# Patient Record
Sex: Female | Born: 1960 | Hispanic: Yes | Smoking: Never smoker
Health system: Southern US, Community
[De-identification: ages and names within clinical notes are randomized; demographics above are authoritative.]

## PROBLEM LIST (undated history)

## (undated) DIAGNOSIS — D649 Anemia, unspecified: Secondary | ICD-10-CM

---

## 1983-09-23 HISTORY — PX: CHOLECYSTECTOMY: SHX55

## 2011-03-14 ENCOUNTER — Other Ambulatory Visit (HOSPITAL_COMMUNITY)
Admission: RE | Admit: 2011-03-14 | Discharge: 2011-03-14 | Disposition: A | Discharge status: Home / Self Care | Payer: 59 | Source: Ambulatory Visit | Attending: Family Medicine | Admitting: Family Medicine

## 2011-03-14 DIAGNOSIS — Z124 Encounter for screening for malignant neoplasm of cervix: Secondary | ICD-10-CM | POA: Insufficient documentation

## 2012-07-02 ENCOUNTER — Other Ambulatory Visit (HOSPITAL_COMMUNITY)
Admission: RE | Admit: 2012-07-02 | Discharge: 2012-07-02 | Disposition: A | Discharge status: Home / Self Care | Payer: 59 | Source: Ambulatory Visit | Attending: Obstetrics and Gynecology | Admitting: Obstetrics and Gynecology

## 2012-07-02 DIAGNOSIS — Z01419 Encounter for gynecological examination (general) (routine) without abnormal findings: Secondary | ICD-10-CM | POA: Insufficient documentation

## 2012-08-23 ENCOUNTER — Encounter (HOSPITAL_COMMUNITY): Payer: Self-pay | Admitting: Anesthesiology

## 2012-08-23 ENCOUNTER — Other Ambulatory Visit: Payer: Self-pay | Admitting: Obstetrics and Gynecology

## 2012-08-23 ENCOUNTER — Encounter (HOSPITAL_COMMUNITY)
Admission: RE | Disposition: A | Discharge status: Home / Self Care | Payer: Self-pay | Source: Ambulatory Visit | Attending: Obstetrics and Gynecology

## 2012-08-23 ENCOUNTER — Ambulatory Visit (HOSPITAL_COMMUNITY)
Admission: RE | Admit: 2012-08-23 | Discharge: 2012-08-23 | Disposition: A | Discharge status: Home / Self Care | Payer: 59 | Source: Ambulatory Visit | Attending: Obstetrics and Gynecology | Admitting: Obstetrics and Gynecology

## 2012-08-23 ENCOUNTER — Encounter (HOSPITAL_COMMUNITY): Payer: Self-pay | Admitting: *Deleted

## 2012-08-23 ENCOUNTER — Ambulatory Visit (HOSPITAL_COMMUNITY): Payer: 59 | Admitting: Anesthesiology

## 2012-08-23 DIAGNOSIS — N920 Excessive and frequent menstruation with regular cycle: Secondary | ICD-10-CM

## 2012-08-23 DIAGNOSIS — D649 Anemia, unspecified: Secondary | ICD-10-CM | POA: Diagnosis present

## 2012-08-23 HISTORY — DX: Anemia, unspecified: D64.9

## 2012-08-23 HISTORY — PX: DILITATION & CURRETTAGE/HYSTROSCOPY WITH HYDROTHERMAL ABLATION: SHX5570

## 2012-08-23 LAB — CBC
Platelets: 367 10*3/uL (ref 150–400)
RBC: 4.2 MIL/uL (ref 3.87–5.11)
WBC: 5.1 10*3/uL (ref 4.0–10.5)

## 2012-08-23 LAB — BASIC METABOLIC PANEL
CO2: 28 mEq/L (ref 19–32)
Calcium: 8.6 mg/dL (ref 8.4–10.5)
Chloride: 105 mEq/L (ref 96–112)
Sodium: 142 mEq/L (ref 135–145)

## 2012-08-23 LAB — PREGNANCY, URINE: Preg Test, Ur: NEGATIVE

## 2012-08-23 SURGERY — DILATATION & CURETTAGE/HYSTEROSCOPY WITH HYDROTHERMAL ABLATION
Anesthesia: General | Wound class: Clean Contaminated

## 2012-08-23 MED ORDER — FENTANYL CITRATE 0.05 MG/ML IJ SOLN
INTRAMUSCULAR | Status: DC | PRN
  Administered 2012-08-23: 50 ug via INTRAVENOUS

## 2012-08-23 MED ORDER — KETOROLAC TROMETHAMINE 30 MG/ML IJ SOLN
INTRAMUSCULAR | Status: AC
  Filled 2012-08-23: qty 1

## 2012-08-23 MED ORDER — FENTANYL CITRATE 0.05 MG/ML IJ SOLN
INTRAMUSCULAR | Status: AC
  Filled 2012-08-23: qty 2

## 2012-08-23 MED ORDER — PROPOFOL 10 MG/ML IV EMUL
INTRAVENOUS | Status: DC | PRN
  Administered 2012-08-23: 70 mg via INTRAVENOUS
  Administered 2012-08-23: 180 mg via INTRAVENOUS

## 2012-08-23 MED ORDER — PROPOFOL 10 MG/ML IV EMUL
INTRAVENOUS | Status: AC
  Filled 2012-08-23: qty 20

## 2012-08-23 MED ORDER — FENTANYL CITRATE 0.05 MG/ML IJ SOLN
25.0000 ug | INTRAMUSCULAR | Status: DC | PRN
  Administered 2012-08-23: 50 ug via INTRAVENOUS

## 2012-08-23 MED ORDER — SODIUM CHLORIDE 0.9 % IR SOLN
Status: DC | PRN
  Administered 2012-08-23: 3000 mL via INTRAVESICAL

## 2012-08-23 MED ORDER — LIDOCAINE HCL (CARDIAC) 20 MG/ML IV SOLN
INTRAVENOUS | Status: AC
  Filled 2012-08-23: qty 5

## 2012-08-23 MED ORDER — MIDAZOLAM HCL 2 MG/2ML IJ SOLN
INTRAMUSCULAR | Status: AC
  Filled 2012-08-23: qty 2

## 2012-08-23 MED ORDER — KETOROLAC TROMETHAMINE 30 MG/ML IJ SOLN: 15.0000 mg | Freq: Once | INTRAMUSCULAR | Status: DC | PRN

## 2012-08-23 MED ORDER — LACTATED RINGERS IV SOLN
INTRAVENOUS | Status: DC
  Administered 2012-08-23 (×3): via INTRAVENOUS

## 2012-08-23 MED ORDER — BUPIVACAINE HCL (PF) 0.25 % IJ SOLN
INTRAMUSCULAR | Status: DC | PRN
  Administered 2012-08-23: 20 mL

## 2012-08-23 MED ORDER — BUPIVACAINE HCL (PF) 0.25 % IJ SOLN
INTRAMUSCULAR | Status: AC
  Filled 2012-08-23: qty 30

## 2012-08-23 MED ORDER — ONDANSETRON HCL 4 MG/2ML IJ SOLN
INTRAMUSCULAR | Status: AC
  Filled 2012-08-23: qty 2

## 2012-08-23 MED ORDER — MIDAZOLAM HCL 5 MG/5ML IJ SOLN
INTRAMUSCULAR | Status: DC | PRN
  Administered 2012-08-23: 2 mg via INTRAVENOUS

## 2012-08-23 MED ORDER — SILVER NITRATE-POT NITRATE 75-25 % EX MISC
CUTANEOUS | Status: AC
  Filled 2012-08-23: qty 2

## 2012-08-23 MED ORDER — LIDOCAINE HCL (CARDIAC) 20 MG/ML IV SOLN
INTRAVENOUS | Status: DC | PRN
  Administered 2012-08-23: 50 mg via INTRAVENOUS
  Administered 2012-08-23: 30 mg via INTRAVENOUS
  Administered 2012-08-23: 20 mg via INTRAVENOUS

## 2012-08-23 MED ORDER — HYDROCODONE-ACETAMINOPHEN 5-500 MG PO TABS: ORAL_TABLET | ORAL | Status: AC

## 2012-08-23 MED ORDER — ONDANSETRON HCL 4 MG/2ML IJ SOLN
INTRAMUSCULAR | Status: DC | PRN
  Administered 2012-08-23: 4 mg via INTRAVENOUS

## 2012-08-23 MED ORDER — KETOROLAC TROMETHAMINE 30 MG/ML IJ SOLN
INTRAMUSCULAR | Status: DC | PRN
  Administered 2012-08-23: 30 mg via INTRAVENOUS

## 2012-08-23 MED ORDER — IBUPROFEN 800 MG PO TABS: ORAL_TABLET | ORAL | Status: AC

## 2012-08-23 SURGICAL SUPPLY — 22 items
CANISTER SUCTION 2500CC (MISCELLANEOUS) ×2 IMPLANT
CATH ROBINSON RED A/P 16FR (CATHETERS) ×2 IMPLANT
CLOTH BEACON ORANGE TIMEOUT ST (SAFETY) ×2 IMPLANT
CONTAINER PREFILL 10% NBF 60ML (FORM) ×4 IMPLANT
DILATOR CANAL MILEX (MISCELLANEOUS) ×2 IMPLANT
DRESSING TELFA 8X3 (GAUZE/BANDAGES/DRESSINGS) ×2 IMPLANT
ELECT REM PT RETURN 9FT ADLT (ELECTROSURGICAL)
ELECTRODE REM PT RTRN 9FT ADLT (ELECTROSURGICAL) IMPLANT
GLOVE BIOGEL M 6.5 STRL (GLOVE) ×4 IMPLANT
GLOVE BIOGEL PI IND STRL 6.5 (GLOVE) ×2 IMPLANT
GLOVE BIOGEL PI IND STRL 7.0 (GLOVE) ×1 IMPLANT
GLOVE BIOGEL PI INDICATOR 6.5 (GLOVE) ×2
GLOVE BIOGEL PI INDICATOR 7.0 (GLOVE) ×1
GLOVE INDICATOR 7.0 STRL GRN (GLOVE) ×2 IMPLANT
GLOVE SURG SS PI 6.5 STRL IVOR (GLOVE) ×2 IMPLANT
GOWN STRL REIN XL XLG (GOWN DISPOSABLE) ×4 IMPLANT
PACK HYSTEROSCOPY LF (CUSTOM PROCEDURE TRAY) IMPLANT
PACK VAGINAL MINOR WOMEN LF (CUSTOM PROCEDURE TRAY) ×2 IMPLANT
PAD OB MATERNITY 4.3X12.25 (PERSONAL CARE ITEMS) ×2 IMPLANT
SET GENESYS HTA PROCERVA (MISCELLANEOUS) ×2 IMPLANT
TOWEL OR 17X24 6PK STRL BLUE (TOWEL DISPOSABLE) ×4 IMPLANT
WATER STERILE IRR 1000ML POUR (IV SOLUTION) ×2 IMPLANT

## 2012-08-23 NOTE — Anesthesia Preprocedure Evaluation (Signed)
Anesthesia Evaluation  Patient identified by MRN, date of birth, ID band Patient awake    Reviewed: Allergy & Precautions, H&P , NPO status , Patient's Chart, lab work & pertinent test results, reviewed documented beta blocker date and time   History of Anesthesia Complications Negative for: history of anesthetic complications  Airway Mallampati: III TM Distance: >3 FB Neck ROM: full    Dental  (+) Teeth Intact   Pulmonary neg pulmonary ROS,  breath sounds clear to auscultation  Pulmonary exam normal       Cardiovascular negative cardio ROS  Rhythm:regular Rate:Normal     Neuro/Psych negative neurological ROS  negative psych ROS   GI/Hepatic negative GI ROS, Neg liver ROS,   Endo/Other  negative endocrine ROS  Renal/GU negative Renal ROS  Female GU complaint     Musculoskeletal   Abdominal   Peds  Hematology  (+) anemia ,   Anesthesia Other Findings   Reproductive/Obstetrics negative OB ROS                           Anesthesia Physical Anesthesia Plan  ASA: II  Anesthesia Plan: General LMA   Post-op Pain Management:    Induction:   Airway Management Planned:   Additional Equipment:   Intra-op Plan:   Post-operative Plan:   Informed Consent: I have reviewed the patients History and Physical, chart, labs and discussed the procedure including the risks, benefits and alternatives for the proposed anesthesia with the patient or authorized representative who has indicated his/her understanding and acceptance.   Dental Advisory Given  Plan Discussed with: CRNA and Surgeon  Anesthesia Plan Comments:         Anesthesia Quick Evaluation

## 2012-08-23 NOTE — Op Note (Signed)
08/23/2012  3:22 PM  PATIENT:  Mackenzie Cox  51 y.o. female  PRE-OPERATIVE DIAGNOSIS:  Menorrhagia  POST-OPERATIVE DIAGNOSIS:  Menorrhagia  PROCEDURE:  Hydrothermal Ablation  SURGEON:  Surgeon(s) and Role:    * Dorien Chihuahua. Richardson Dopp, MD - Primary  PHYSICIAN ASSISTANT: None  ASSISTANTS: none   ANESTHESIA:   general  EBL:  Total I/O In: 2200 [I.V.:2200] Out: 250 [Urine:250]  BLOOD ADMINISTERED:none  DRAINS: none   LOCAL MEDICATIONS USED:  MARCAINE   20 cc used for paracervical block   SPECIMEN:  No Specimen  DISPOSITION OF SPECIMEN:  N/A  COUNTS:  YES  TOURNIQUET:  * No tourniquets in log *  DICTATION: .Dragon Dictation  PLAN OF CARE: Discharge to home after PACU  PATIENT DISPOSITION:  PACU - hemodynamically stable.   Delay start of Pharmacological VTE agent (>24hrs) due to surgical blood loss or risk of bleeding: not applicable  Findings normal endometrium ... No proliferation was noted...   Procedure. The patient was taken to the operative room were a time out was performed. She was placed in the dorsal lithotomy position and prepped and draped in the normal sterile fashion. A speculum was placed in the vaginal vault. The anterior lip of the cervix was grasped with a single tooth tenaculum. The uterus was sounded to 8 cm. The cervix was dilated to 8 mm. The hydrothermal hysteroscope was inserted. The ostia were visualized bilaterally. There was no evidence of perforation. The hydrothermal ablation was performed for 10 minutes.  The hysteroscope was removed. 10 cc of 25% marcaine was injected at the 4 and 8 oclock position.  The single tooth tenaculum was removed.  Excellent hemostasis was noted.   Sponge lap and needle counts were correct x 2.  The patient was awakened from anesthesia and taken to the recovery room in stable condition.

## 2012-08-23 NOTE — Transfer of Care (Signed)
Immediate Anesthesia Transfer of Care Note  Patient: Mackenzie Cox  Procedure(s) Performed: Procedure(s) (LRB) with comments: DILATATION & CURETTAGE/HYSTEROSCOPY WITH HYDROTHERMAL ABLATION (N/A)  Patient Location: PACU  Anesthesia Type:General  Level of Consciousness: awake, alert , sedated and patient cooperative  Airway & Oxygen Therapy: Patient Spontanous Breathing and Patient connected to nasal cannula oxygen  Post-op Assessment: Report given to PACU RN and Post -op Vital signs reviewed and stable  Post vital signs: Reviewed and stable  Complications: No apparent anesthesia complications

## 2012-08-23 NOTE — H&P (Signed)
Date of Initial H&P:08/03/2012  History reviewed, patient examined, no change in status, stable for surgery.

## 2012-08-24 ENCOUNTER — Encounter (HOSPITAL_COMMUNITY): Payer: Self-pay | Admitting: Obstetrics and Gynecology

## 2012-08-24 NOTE — Anesthesia Postprocedure Evaluation (Signed)
Anesthesia Post Note  Patient: Mackenzie Cox  Procedure(s) Performed: Procedure(s) (LRB): DILATATION & CURETTAGE/HYSTEROSCOPY WITH HYDROTHERMAL ABLATION (N/A)  Anesthesia type: General  Patient location: PACU  Post pain: Pain level controlled  Post assessment: Post-op Vital signs reviewed  Last Vitals:  Filed Vitals:   08/23/12 1601  BP:   Pulse: 58  Temp: 36.9 C  Resp: 19    Post vital signs: Reviewed  Level of consciousness: sedated  Complications: No apparent anesthesia complications

## 2014-11-28 ENCOUNTER — Other Ambulatory Visit: Payer: Self-pay | Admitting: Family Medicine

## 2014-11-28 ENCOUNTER — Other Ambulatory Visit (HOSPITAL_COMMUNITY)
Admission: RE | Admit: 2014-11-28 | Discharge: 2014-11-28 | Disposition: A | Discharge status: Home / Self Care | Payer: 59 | Source: Ambulatory Visit | Attending: Family Medicine | Admitting: Family Medicine

## 2014-11-28 DIAGNOSIS — Z124 Encounter for screening for malignant neoplasm of cervix: Secondary | ICD-10-CM | POA: Diagnosis not present

## 2014-11-29 LAB — CYTOLOGY - PAP

## 2016-07-04 ENCOUNTER — Ambulatory Visit
Admission: RE | Admit: 2016-07-04 | Discharge: 2016-07-04 | Disposition: A | Discharge status: Home / Self Care | Payer: Worker's Compensation | Source: Ambulatory Visit | Attending: Nurse Practitioner | Admitting: Nurse Practitioner

## 2016-07-04 ENCOUNTER — Other Ambulatory Visit: Payer: Self-pay | Admitting: Nurse Practitioner

## 2016-07-04 DIAGNOSIS — M25561 Pain in right knee: Secondary | ICD-10-CM

## 2016-07-04 DIAGNOSIS — T1490XA Injury, unspecified, initial encounter: Secondary | ICD-10-CM

## 2016-07-04 DIAGNOSIS — R609 Edema, unspecified: Secondary | ICD-10-CM

## 2016-07-21 ENCOUNTER — Ambulatory Visit (INDEPENDENT_AMBULATORY_CARE_PROVIDER_SITE_OTHER): Payer: 59 | Admitting: Orthopedic Surgery

## 2016-07-21 ENCOUNTER — Encounter (INDEPENDENT_AMBULATORY_CARE_PROVIDER_SITE_OTHER): Payer: Self-pay | Admitting: Orthopedic Surgery

## 2016-07-21 VITALS — Ht 66.0 in | Wt 180.0 lb

## 2016-07-21 DIAGNOSIS — M7041 Prepatellar bursitis, right knee: Secondary | ICD-10-CM | POA: Diagnosis not present

## 2016-07-21 MED ORDER — METHYLPREDNISOLONE ACETATE 40 MG/ML IJ SUSP
40.0000 mg | INTRAMUSCULAR | Status: AC | PRN
  Administered 2016-07-21: 40 mg via INTRA_ARTICULAR

## 2016-07-21 MED ORDER — LIDOCAINE HCL 1 % IJ SOLN
5.0000 mL | INTRAMUSCULAR | Status: AC | PRN
  Administered 2016-07-21: 5 mL

## 2016-07-21 NOTE — Progress Notes (Signed)
Office Visit Note   Patient: Johann CapersDeborah A Buckels           Date of Birth: 13-Aug-1961           MRN: [id number] Visit Date: 07/21/2016              Requested by: Juluis RainierElizabeth Barnes, MD [address] [place], [place] [address] PCP: Gaye AlkenBARNES,ELIZABETH STEWART, MD   Assessment & Plan: Visit Diagnoses:  1. Prepatellar bursitis of right knee     Plan: Recommend neoprene knee sleeve for compression recommended ice elevation and follow-up as needed. Patient has no restrictions with her work which she states is seated work.  Follow-Up Instructions: Return if symptoms worsen or fail to improve.   Orders:  No orders of the defined types were placed in this encounter.  No orders of the defined types were placed in this encounter.     Procedures: Large Joint Inj Date/Time: 07/21/2016 10:53 AM Performed by: Mckoy Bhakta V Authorized by: Nadara MustardUDA, Eretria Manternach V   Consent Given by:  Patient Site marked: the procedure site was marked   Timeout: prior to procedure the correct patient, procedure, and site was verified   Indications:  Pain and diagnostic evaluation Location:  Knee Site:  R knee Needle Size:  22 G Needle Length:  1.5 inches Approach:  Anterior Ultrasound Guidance: No   Fluoroscopic Guidance: No   Arthrogram: No Medications:  5 mL lidocaine 1 %; 40 mg methylPREDNISolone acetate 40 MG/ML Aspiration Attempted: No   Patient tolerance:  Patient tolerated the procedure well with no immediate complications     Clinical Data: No additional findings.   Subjective: Chief Complaint  Patient presents with  . Right Knee - Pain    At work injury original date 06/28/16 and then fell again yesterday 07/20/16    Pt states that she injured her knee on 06/28/16. She was walking at work and she she tripped on new carpeting at work. She fell direct impact on right knee. She  Tried ice and rest and this was not doing any better. After not resolving she was sent to see a NP with the city of  [place]. Patient has a right knee xray on 07/04/16 that was WNL. She was advised to take Ibuprofen and continue to ice and limit high impact activities. Pt went to urgent care yesterday 07/20/16 after another direct impact fall on knee. States that it felt like it gave way. Was given ibuprofen 800mg  tid, crutches to avoid wtb and continue with ice. Patient went back to the state NP today in follow up and was advised to go to ortho. Pt c/o anterior pain and swelling " knee needs to be drained"      Review of Systems   Objective: Vital Signs: Ht 5\' 6"  (1.676 m)   Wt 180 lb (81.6 kg)   BMI 29.05 kg/m   Physical Exam Patient is alert oriented no adenopathy well-dressed normal affect normal respiratory effort.    Examination patient does have an antalgic gait. She has prepatellar bursal swelling. Her initial injury was approximately 3 weeks ago. Patient recently fell yesterday directly on the same injured knee. Examination patient has bruising down into her calf from the initial injury. Anterior drawer and Lachman is stable collaterals are stable. Medial and lateral joint line is nontender to palpation. She has active extension no extensor mechanism injury. There is no effusion within the joint she does have prepatellar bursal swelling. After informed consent the knee was sterilely  prepped and no fluid was aspirated from the prepatellar bursa. This was injected with 1 mL Depo-Medrol 40 mg and 1 mL of 1% lidocaine plain she tolerated this well. Ortho Exam  Specialty Comments:  No specialty comments available.  Imaging: No results found.   PMFS History: Patient Active Problem List   Diagnosis Date Noted  . Prepatellar bursitis of right knee 07/21/2016  . Menorrhagia 08/23/2012  . Anemia 08/23/2012   Past Medical History:  Diagnosis Date  . Anemia     No family history on file.  Past Surgical History:  Procedure Laterality Date  . CESAREAN SECTION  1990  . CHOLECYSTECTOMY  1985  .  DILITATION & CURRETTAGE/HYSTROSCOPY WITH HYDROTHERMAL ABLATION  08/23/2012   Procedure: DILATATION & CURETTAGE/HYSTEROSCOPY WITH HYDROTHERMAL ABLATION;  Surgeon: Dorien Chihuahuaara J. Richardson Doppole, MD;  Location: [facility];  Service: Gynecology;  Laterality: N/A;   Social History   Occupational History  . Not on file.   Social History Main Topics  . Smoking status: Never Smoker  . Smokeless tobacco: Not on file  . Alcohol use Yes     Comment: social  . Drug use: No  . Sexual activity: No

## 2016-07-24 ENCOUNTER — Encounter (HOSPITAL_COMMUNITY): Payer: Self-pay

## 2016-07-24 ENCOUNTER — Emergency Department (HOSPITAL_COMMUNITY)
Admission: EM | Admit: 2016-07-24 | Discharge: 2016-07-24 | Disposition: A | Discharge status: Home / Self Care | Payer: 59 | Attending: Dermatology | Admitting: Dermatology

## 2016-07-24 DIAGNOSIS — Y929 Unspecified place or not applicable: Secondary | ICD-10-CM | POA: Diagnosis not present

## 2016-07-24 DIAGNOSIS — W19XXXA Unspecified fall, initial encounter: Secondary | ICD-10-CM | POA: Insufficient documentation

## 2016-07-24 DIAGNOSIS — Y999 Unspecified external cause status: Secondary | ICD-10-CM | POA: Insufficient documentation

## 2016-07-24 DIAGNOSIS — Y939 Activity, unspecified: Secondary | ICD-10-CM | POA: Insufficient documentation

## 2016-07-24 DIAGNOSIS — S20211A Contusion of right front wall of thorax, initial encounter: Secondary | ICD-10-CM | POA: Diagnosis not present

## 2016-07-24 DIAGNOSIS — Z5321 Procedure and treatment not carried out due to patient leaving prior to being seen by health care provider: Secondary | ICD-10-CM | POA: Insufficient documentation

## 2016-07-24 NOTE — ED Triage Notes (Signed)
Pt fell on Sunday and bruised her right side Pt complains of right rib pain

## 2016-09-18 ENCOUNTER — Ambulatory Visit (INDEPENDENT_AMBULATORY_CARE_PROVIDER_SITE_OTHER): Payer: 59 | Admitting: Orthopedic Surgery

## 2016-09-19 ENCOUNTER — Ambulatory Visit (INDEPENDENT_AMBULATORY_CARE_PROVIDER_SITE_OTHER): Payer: 59 | Admitting: Family

## 2016-09-19 ENCOUNTER — Encounter (INDEPENDENT_AMBULATORY_CARE_PROVIDER_SITE_OTHER): Payer: Self-pay

## 2016-09-19 VITALS — Ht 66.0 in | Wt 180.0 lb

## 2016-09-19 DIAGNOSIS — M25561 Pain in right knee: Secondary | ICD-10-CM

## 2016-09-19 NOTE — Progress Notes (Signed)
Office Visit Note   Patient: Mackenzie CapersDeborah A Cox           Date of Birth: 1960/11/14           MRN: [id number] Visit Date: 09/19/2016              Requested by: Juluis RainierElizabeth Barnes, MD [address] [place], [place] [address] PCP: Gaye AlkenBARNES,ELIZABETH STEWART, MD   Assessment & Plan: Visit Diagnoses:  1. Right knee pain, unspecified chronicity     Plan: Reassurance was provided. Patient will continue with the neoprene knee sleeve for compression. Recommended that she continue with ibuprofen as needed for pain. May use heat compresses as well.  Follow-Up Instructions: Return in about 4 weeks (around 10/17/2016), or if symptoms worsen or fail to improve.   Orders:  No orders of the defined types were placed in this encounter.  No orders of the defined types were placed in this encounter.     Procedures: No procedures performed   Clinical Data: No additional findings.   Subjective: Chief Complaint  Patient presents with  . Right Knee - Pain    At work injury original date 06/28/16 and then fell again 07/20/16       Patient is a 55 year old woman who is seen today for evaluation of ongoing knee pain and swelling. Initially injured the right knee on 06/28/16 by falling and landing on anterior knee. Has had ongoing swelling, bruising and aching pain. States it is tender to touch. She wears a knee sleeve when she is doing increased activities. She is full weight bearing and not using anything for pain. 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 Patient had an injection 07/21/16 which provided good interim relief. Pain has returned. Patient states, "just want to make sure that everything is ok."    Review of Systems  Constitutional: Negative for chills and fever.  Musculoskeletal: Positive for arthralgias.  Skin: Positive for color change.     Objective: Vital Signs: Ht 5\' 6"  (1.676 m)   Wt 180 lb (81.6 kg)   BMI 29.05 kg/m   Physical Exam    Constitutional: She is oriented to person, place, and time. She appears well-developed and well-nourished.  Pulmonary/Chest: Effort normal.  Musculoskeletal:       Right knee: She exhibits no effusion.  Neurological: She is alert and oriented to person, place, and time.  Psychiatric: She has a normal mood and affect.  Nursing note reviewed.   Right Knee Exam   Tenderness  Right knee tenderness location: Anterior hematoma is tender to palpation.  Range of Motion  The patient has normal right knee ROM.  Muscle Strength   The patient has normal right knee strength.  Tests  Varus: negative Valgus: negative  Other  Right knee swelling: Does have a hematoma anteriomedially. Other tests: no effusion present      Specialty Comments:  No specialty comments available.  Imaging: No results found.   PMFS History: Patient Active Problem List   Diagnosis Date Noted  . Prepatellar bursitis of right knee 07/21/2016  . Menorrhagia 08/23/2012  . Anemia 08/23/2012   Past Medical History:  Diagnosis Date  . Anemia     No family history on file.  Past Surgical History:  Procedure Laterality Date  . CESAREAN SECTION  1990  . CHOLECYSTECTOMY  1985  . DILITATION & CURRETTAGE/HYSTROSCOPY WITH HYDROTHERMAL ABLATION  08/23/2012   Procedure: DILATATION & CURETTAGE/HYSTEROSCOPY WITH HYDROTHERMAL ABLATION;  Surgeon: Dorien Chihuahuaara J.  Richardson Doppole, MD;  Location: [facility];  Service: Gynecology;  Laterality: N/A;   Social History   Occupational History  . Not on file.   Social History Main Topics  . Smoking status: Never Smoker  . Smokeless tobacco: Never Used  . Alcohol use Yes     Comment: social  . Drug use: No  . Sexual activity: No

## 2016-09-26 ENCOUNTER — Ambulatory Visit (INDEPENDENT_AMBULATORY_CARE_PROVIDER_SITE_OTHER): Payer: 59 | Admitting: Orthopedic Surgery

## 2016-10-17 DIAGNOSIS — M9903 Segmental and somatic dysfunction of lumbar region: Secondary | ICD-10-CM | POA: Diagnosis not present

## 2016-10-17 DIAGNOSIS — M9904 Segmental and somatic dysfunction of sacral region: Secondary | ICD-10-CM | POA: Diagnosis not present

## 2016-10-17 DIAGNOSIS — R600 Localized edema: Secondary | ICD-10-CM | POA: Diagnosis not present

## 2016-11-14 DIAGNOSIS — M9903 Segmental and somatic dysfunction of lumbar region: Secondary | ICD-10-CM | POA: Diagnosis not present

## 2016-11-14 DIAGNOSIS — M9904 Segmental and somatic dysfunction of sacral region: Secondary | ICD-10-CM | POA: Diagnosis not present

## 2016-11-14 DIAGNOSIS — R600 Localized edema: Secondary | ICD-10-CM | POA: Diagnosis not present

## 2016-12-11 DIAGNOSIS — M9904 Segmental and somatic dysfunction of sacral region: Secondary | ICD-10-CM | POA: Diagnosis not present

## 2016-12-11 DIAGNOSIS — R600 Localized edema: Secondary | ICD-10-CM | POA: Diagnosis not present

## 2016-12-11 DIAGNOSIS — M9903 Segmental and somatic dysfunction of lumbar region: Secondary | ICD-10-CM | POA: Diagnosis not present

## 2017-01-09 DIAGNOSIS — R600 Localized edema: Secondary | ICD-10-CM | POA: Diagnosis not present

## 2017-01-09 DIAGNOSIS — M9904 Segmental and somatic dysfunction of sacral region: Secondary | ICD-10-CM | POA: Diagnosis not present

## 2017-01-09 DIAGNOSIS — M9903 Segmental and somatic dysfunction of lumbar region: Secondary | ICD-10-CM | POA: Diagnosis not present

## 2017-02-06 DIAGNOSIS — M9903 Segmental and somatic dysfunction of lumbar region: Secondary | ICD-10-CM | POA: Diagnosis not present

## 2017-02-06 DIAGNOSIS — M9905 Segmental and somatic dysfunction of pelvic region: Secondary | ICD-10-CM | POA: Diagnosis not present

## 2017-02-06 DIAGNOSIS — M9904 Segmental and somatic dysfunction of sacral region: Secondary | ICD-10-CM | POA: Diagnosis not present

## 2017-03-06 DIAGNOSIS — M9904 Segmental and somatic dysfunction of sacral region: Secondary | ICD-10-CM | POA: Diagnosis not present

## 2017-03-06 DIAGNOSIS — M9903 Segmental and somatic dysfunction of lumbar region: Secondary | ICD-10-CM | POA: Diagnosis not present

## 2017-03-06 DIAGNOSIS — M9905 Segmental and somatic dysfunction of pelvic region: Secondary | ICD-10-CM | POA: Diagnosis not present

## 2017-04-13 DIAGNOSIS — Z803 Family history of malignant neoplasm of breast: Secondary | ICD-10-CM | POA: Diagnosis not present

## 2017-04-13 DIAGNOSIS — Z1231 Encounter for screening mammogram for malignant neoplasm of breast: Secondary | ICD-10-CM | POA: Diagnosis not present

## 2017-04-13 DIAGNOSIS — M9903 Segmental and somatic dysfunction of lumbar region: Secondary | ICD-10-CM | POA: Diagnosis not present

## 2017-04-13 DIAGNOSIS — D509 Iron deficiency anemia, unspecified: Secondary | ICD-10-CM | POA: Diagnosis not present

## 2017-04-13 DIAGNOSIS — Z0000 Encounter for general adult medical examination without abnormal findings: Secondary | ICD-10-CM | POA: Diagnosis not present

## 2017-04-13 DIAGNOSIS — M9904 Segmental and somatic dysfunction of sacral region: Secondary | ICD-10-CM | POA: Diagnosis not present

## 2017-04-13 DIAGNOSIS — M9905 Segmental and somatic dysfunction of pelvic region: Secondary | ICD-10-CM | POA: Diagnosis not present

## 2017-04-17 DIAGNOSIS — L720 Epidermal cyst: Secondary | ICD-10-CM | POA: Diagnosis not present

## 2017-05-13 DIAGNOSIS — M9904 Segmental and somatic dysfunction of sacral region: Secondary | ICD-10-CM | POA: Diagnosis not present

## 2017-05-13 DIAGNOSIS — M9903 Segmental and somatic dysfunction of lumbar region: Secondary | ICD-10-CM | POA: Diagnosis not present

## 2017-05-13 DIAGNOSIS — M9905 Segmental and somatic dysfunction of pelvic region: Secondary | ICD-10-CM | POA: Diagnosis not present

## 2017-06-04 DIAGNOSIS — M9904 Segmental and somatic dysfunction of sacral region: Secondary | ICD-10-CM | POA: Diagnosis not present

## 2017-06-04 DIAGNOSIS — M9903 Segmental and somatic dysfunction of lumbar region: Secondary | ICD-10-CM | POA: Diagnosis not present

## 2017-06-04 DIAGNOSIS — M9905 Segmental and somatic dysfunction of pelvic region: Secondary | ICD-10-CM | POA: Diagnosis not present

## 2017-06-15 ENCOUNTER — Ambulatory Visit (INDEPENDENT_AMBULATORY_CARE_PROVIDER_SITE_OTHER): Payer: 59 | Admitting: Family

## 2017-06-15 DIAGNOSIS — M25561 Pain in right knee: Secondary | ICD-10-CM

## 2017-06-15 DIAGNOSIS — G8929 Other chronic pain: Secondary | ICD-10-CM | POA: Diagnosis not present

## 2017-06-15 NOTE — Progress Notes (Signed)
   Office Visit Note   Patient: Mackenzie Cox           Date of Birth: 1961/04/07           MRN: [id number] Visit Date: 06/15/2017              Requested by: Juluis Rainier, MD [address] [place], [place] [address] PCP: Juluis Rainier, MD  Chief Complaint  Patient presents with  . Right Knee - Pain      HPI: The patient is a 56 year old woman who presents today complaining of discomfort in her right knee this is been ongoing for almost a year now. She complains of fullness discomfort with flexing and intermittent swelling. She states she did have an Depo-Medrol injection last December which provided good interval relief. She's been icing and using anti-inflammatories without relief. Is active with water aerobics  Assessment & Plan: Visit Diagnoses:  1. Chronic pain of right knee     Plan: Depo-Medrol injection today she'll follow-up in office as needed  Follow-Up Instructions: Return if symptoms worsen or fail to improve.   Right Knee Exam   Tenderness  The patient is experiencing tenderness in the medial joint line.  Range of Motion  The patient has normal right knee ROM.  Muscle Strength   The patient has normal right knee strength.  Tests  Varus: negative Valgus: negative  Other  Erythema: absent Swelling: moderate Other tests: no effusion present      Patient is alert, oriented, no adenopathy, well-dressed, normal affect, normal respiratory effort.   Imaging: No results found. No images are attached to the encounter.  Labs: No results found for: HGBA1C, ESRSEDRATE, CRP, LABURIC, REPTSTATUS, GRAMSTAIN, CULT, LABORGA  Orders:  No orders of the defined types were placed in this encounter.  No orders of the defined types were placed in this encounter.    Procedures: No procedures performed  Clinical Data: No additional findings.  ROS:  All other systems negative, except as noted in the HPI. Review of Systems    Constitutional: Negative for chills and fever.  Musculoskeletal: Positive for arthralgias and joint swelling.  Neurological: Negative for weakness and numbness.    Objective: Vital Signs: There were no vitals taken for this visit.  Specialty Comments:  No specialty comments available.  PMFS History: Patient Active Problem List   Diagnosis Date Noted  . Prepatellar bursitis of right knee 07/21/2016  . Menorrhagia 08/23/2012  . Anemia 08/23/2012   Past Medical History:  Diagnosis Date  . Anemia     No family history on file.  Past Surgical History:  Procedure Laterality Date  . CESAREAN SECTION  1990  . CHOLECYSTECTOMY  1985  . DILITATION & CURRETTAGE/HYSTROSCOPY WITH HYDROTHERMAL ABLATION  08/23/2012   Procedure: DILATATION & CURETTAGE/HYSTEROSCOPY WITH HYDROTHERMAL ABLATION;  Surgeon: Dorien Chihuahua. Richardson Dopp, MD;  Location: [facility];  Service: Gynecology;  Laterality: N/A;   Social History   Occupational History  . Not on file.   Social History Main Topics  . Smoking status: Never Smoker  . Smokeless tobacco: Never Used  . Alcohol use Yes     Comment: social  . Drug use: No  . Sexual activity: No

## 2017-06-16 DIAGNOSIS — M9905 Segmental and somatic dysfunction of pelvic region: Secondary | ICD-10-CM | POA: Diagnosis not present

## 2017-06-16 DIAGNOSIS — M9903 Segmental and somatic dysfunction of lumbar region: Secondary | ICD-10-CM | POA: Diagnosis not present

## 2017-06-16 DIAGNOSIS — M9904 Segmental and somatic dysfunction of sacral region: Secondary | ICD-10-CM | POA: Diagnosis not present

## 2017-07-17 DIAGNOSIS — M9904 Segmental and somatic dysfunction of sacral region: Secondary | ICD-10-CM | POA: Diagnosis not present

## 2017-07-17 DIAGNOSIS — M9903 Segmental and somatic dysfunction of lumbar region: Secondary | ICD-10-CM | POA: Diagnosis not present

## 2017-07-17 DIAGNOSIS — Z23 Encounter for immunization: Secondary | ICD-10-CM | POA: Diagnosis not present

## 2017-07-17 DIAGNOSIS — M9905 Segmental and somatic dysfunction of pelvic region: Secondary | ICD-10-CM | POA: Diagnosis not present

## 2017-08-17 DIAGNOSIS — M9904 Segmental and somatic dysfunction of sacral region: Secondary | ICD-10-CM | POA: Diagnosis not present

## 2017-08-17 DIAGNOSIS — M9903 Segmental and somatic dysfunction of lumbar region: Secondary | ICD-10-CM | POA: Diagnosis not present

## 2017-08-17 DIAGNOSIS — M9905 Segmental and somatic dysfunction of pelvic region: Secondary | ICD-10-CM | POA: Diagnosis not present

## 2017-09-11 DIAGNOSIS — M9905 Segmental and somatic dysfunction of pelvic region: Secondary | ICD-10-CM | POA: Diagnosis not present

## 2017-09-11 DIAGNOSIS — M9904 Segmental and somatic dysfunction of sacral region: Secondary | ICD-10-CM | POA: Diagnosis not present

## 2017-09-11 DIAGNOSIS — M9903 Segmental and somatic dysfunction of lumbar region: Secondary | ICD-10-CM | POA: Diagnosis not present

## 2017-10-09 DIAGNOSIS — M9904 Segmental and somatic dysfunction of sacral region: Secondary | ICD-10-CM | POA: Diagnosis not present

## 2017-10-09 DIAGNOSIS — M9903 Segmental and somatic dysfunction of lumbar region: Secondary | ICD-10-CM | POA: Diagnosis not present

## 2017-10-09 DIAGNOSIS — M9905 Segmental and somatic dysfunction of pelvic region: Secondary | ICD-10-CM | POA: Diagnosis not present

## 2017-11-13 DIAGNOSIS — M9904 Segmental and somatic dysfunction of sacral region: Secondary | ICD-10-CM | POA: Diagnosis not present

## 2017-11-13 DIAGNOSIS — M9905 Segmental and somatic dysfunction of pelvic region: Secondary | ICD-10-CM | POA: Diagnosis not present

## 2017-11-13 DIAGNOSIS — M9903 Segmental and somatic dysfunction of lumbar region: Secondary | ICD-10-CM | POA: Diagnosis not present

## 2017-11-17 IMAGING — CR DG KNEE COMPLETE 4+V*R*
4 series · 4 of 4 positions shown · non-contrast
Comparison: None.

CLINICAL DATA: Fall 06/28/2016.  Swelling and pain.

EXAM:
RIGHT KNEE - COMPLETE 4+ VIEW

[w knee tunnel pa right]
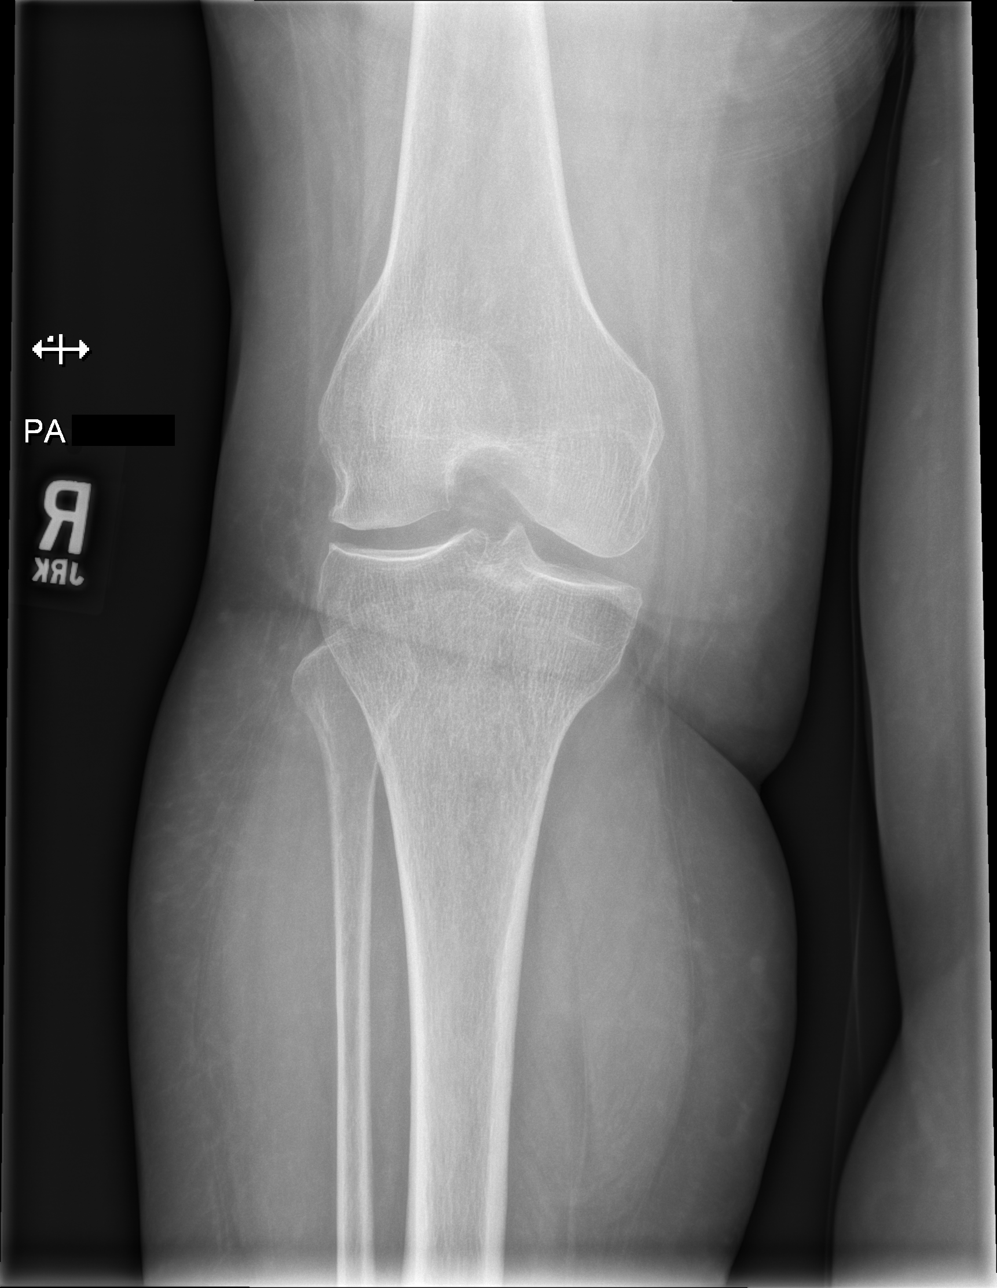

[w knee ap right]
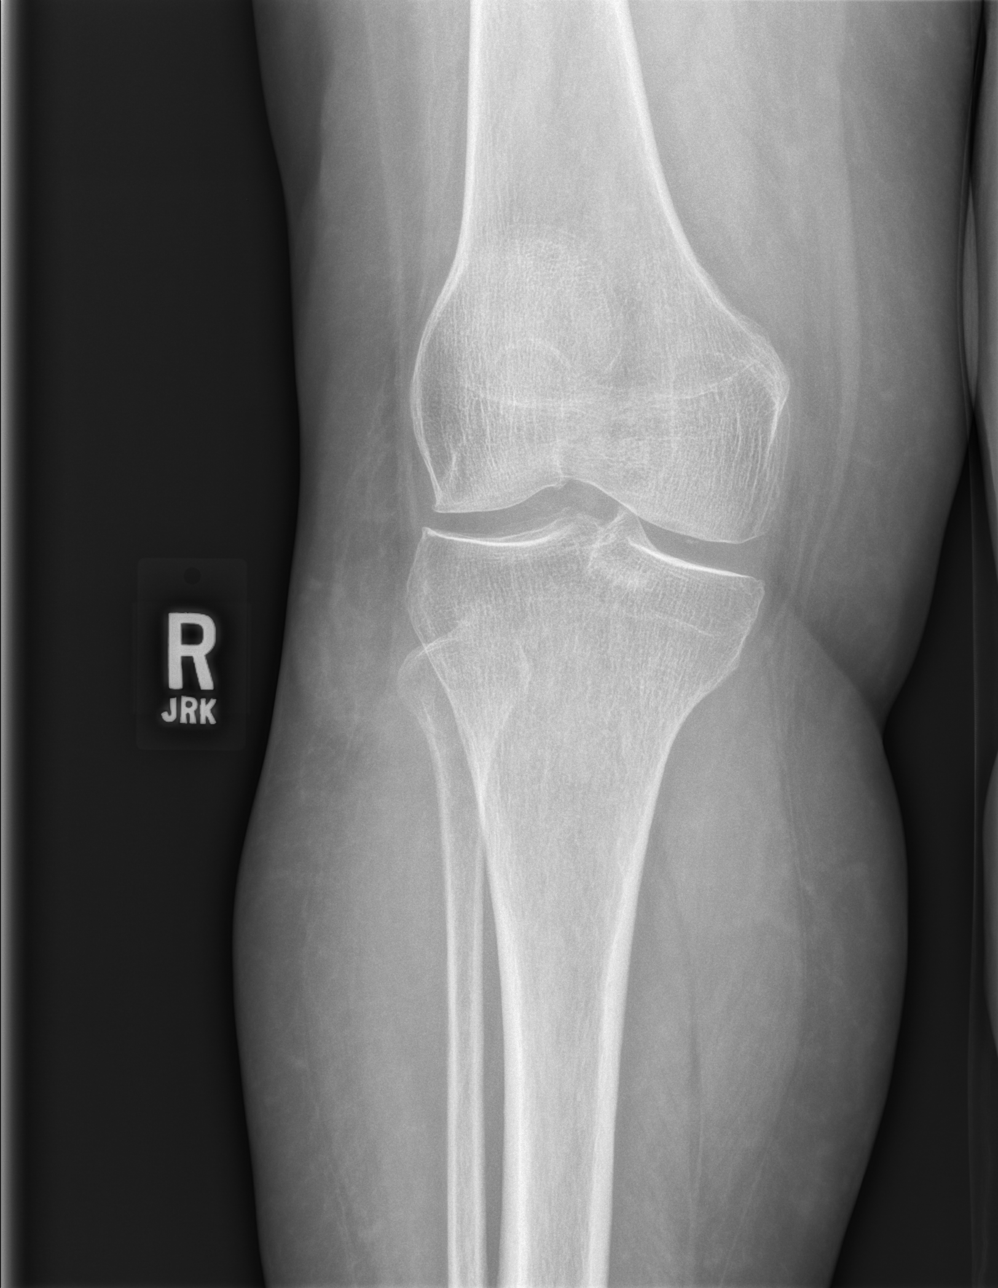

[w knee lat right]
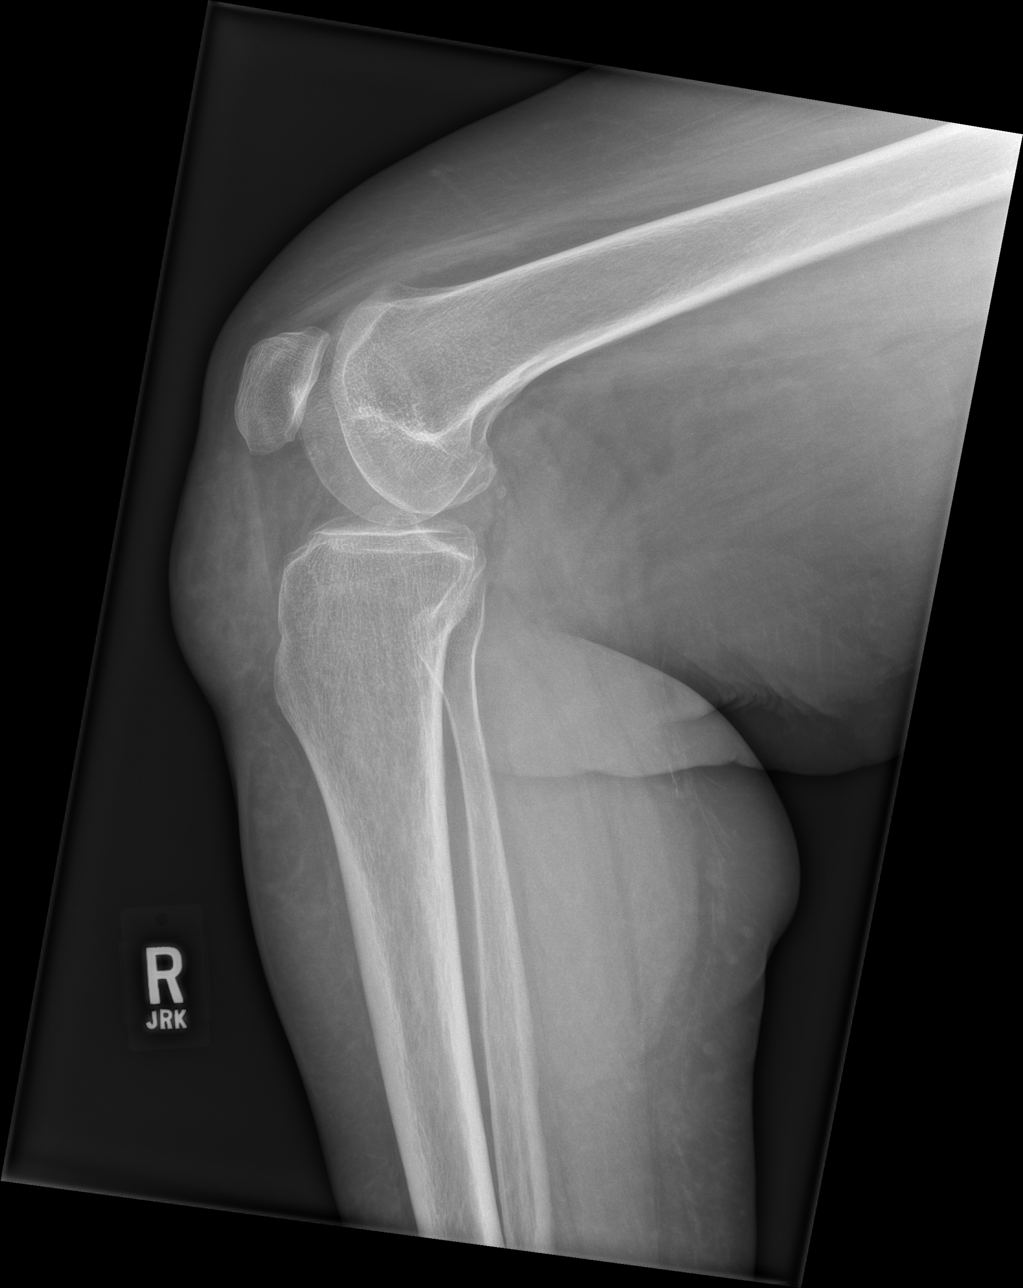

[x knee sunrise right]
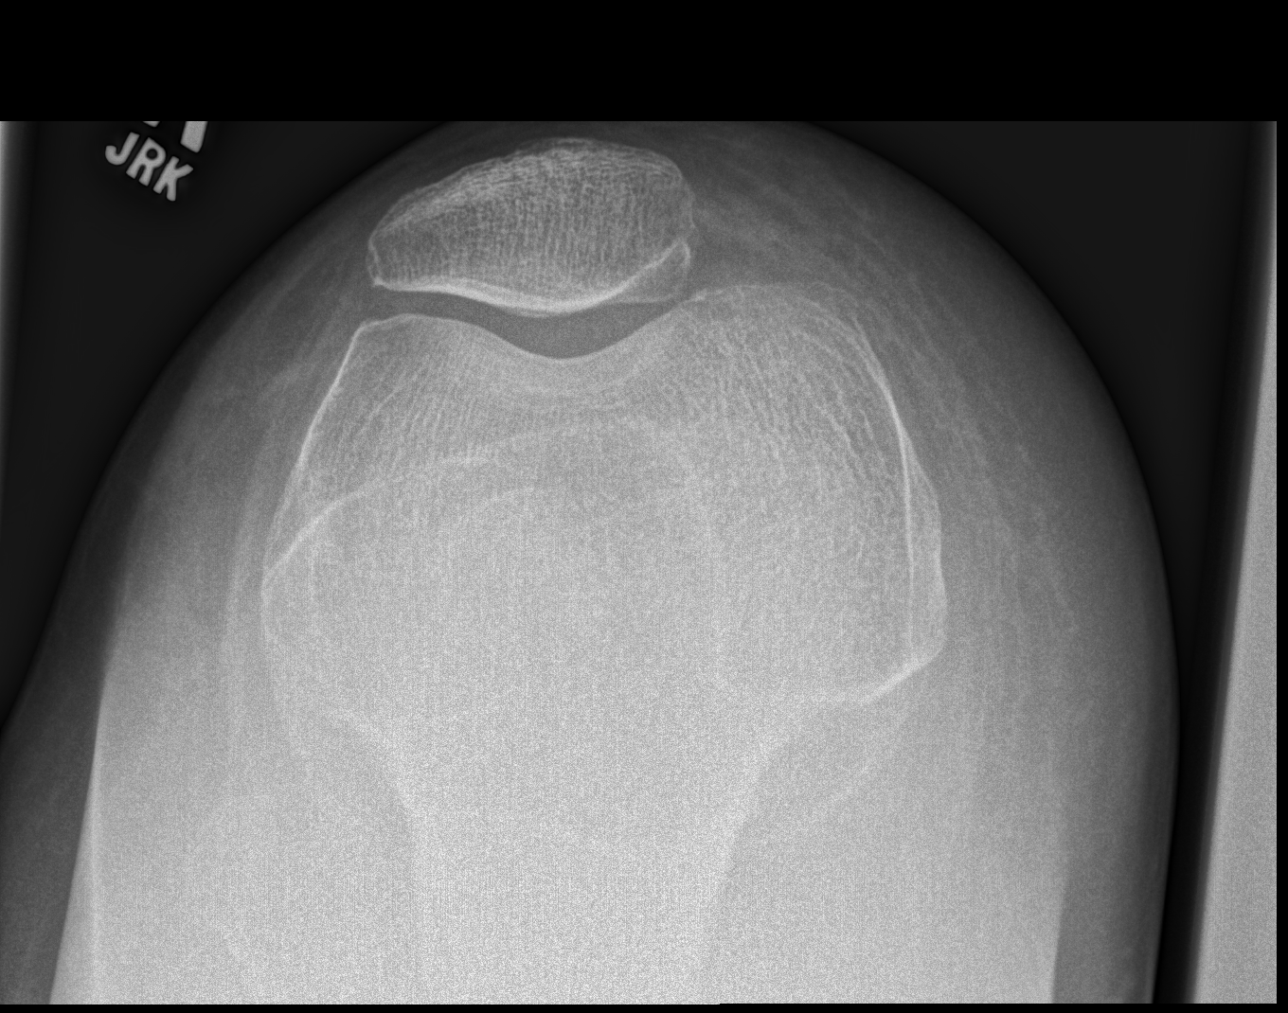

[4 of 4 positions shown; findings below may reference images not displayed]

FINDINGS: Soft tissue swelling noted along the inferior aspect of the knee
joint anteriorly. No acute bony abnormality. Specifically, no
fracture, subluxation, or dislocation. Soft tissues are intact. No
joint effusion
IMPRESSION: No acute bony abnormality.

## 2017-12-16 DIAGNOSIS — M9903 Segmental and somatic dysfunction of lumbar region: Secondary | ICD-10-CM | POA: Diagnosis not present

## 2017-12-16 DIAGNOSIS — M9905 Segmental and somatic dysfunction of pelvic region: Secondary | ICD-10-CM | POA: Diagnosis not present

## 2017-12-16 DIAGNOSIS — M9904 Segmental and somatic dysfunction of sacral region: Secondary | ICD-10-CM | POA: Diagnosis not present

## 2018-01-15 DIAGNOSIS — M9903 Segmental and somatic dysfunction of lumbar region: Secondary | ICD-10-CM | POA: Diagnosis not present

## 2018-01-15 DIAGNOSIS — M9904 Segmental and somatic dysfunction of sacral region: Secondary | ICD-10-CM | POA: Diagnosis not present

## 2018-01-15 DIAGNOSIS — M9905 Segmental and somatic dysfunction of pelvic region: Secondary | ICD-10-CM | POA: Diagnosis not present

## 2018-02-12 DIAGNOSIS — M9905 Segmental and somatic dysfunction of pelvic region: Secondary | ICD-10-CM | POA: Diagnosis not present

## 2018-02-12 DIAGNOSIS — M9903 Segmental and somatic dysfunction of lumbar region: Secondary | ICD-10-CM | POA: Diagnosis not present

## 2018-02-12 DIAGNOSIS — M9904 Segmental and somatic dysfunction of sacral region: Secondary | ICD-10-CM | POA: Diagnosis not present

## 2018-03-10 ENCOUNTER — Ambulatory Visit (INDEPENDENT_AMBULATORY_CARE_PROVIDER_SITE_OTHER): Payer: 59

## 2018-03-10 ENCOUNTER — Encounter (INDEPENDENT_AMBULATORY_CARE_PROVIDER_SITE_OTHER): Payer: Self-pay | Admitting: Family

## 2018-03-10 ENCOUNTER — Ambulatory Visit (INDEPENDENT_AMBULATORY_CARE_PROVIDER_SITE_OTHER): Payer: 59 | Admitting: Family

## 2018-03-10 VITALS — Ht 66.0 in | Wt 180.0 lb

## 2018-03-10 DIAGNOSIS — M25561 Pain in right knee: Secondary | ICD-10-CM | POA: Diagnosis not present

## 2018-03-10 DIAGNOSIS — G8929 Other chronic pain: Secondary | ICD-10-CM

## 2018-03-15 DIAGNOSIS — M9904 Segmental and somatic dysfunction of sacral region: Secondary | ICD-10-CM | POA: Diagnosis not present

## 2018-03-15 DIAGNOSIS — M9905 Segmental and somatic dysfunction of pelvic region: Secondary | ICD-10-CM | POA: Diagnosis not present

## 2018-03-15 DIAGNOSIS — N3000 Acute cystitis without hematuria: Secondary | ICD-10-CM | POA: Diagnosis not present

## 2018-03-15 DIAGNOSIS — M9903 Segmental and somatic dysfunction of lumbar region: Secondary | ICD-10-CM | POA: Diagnosis not present

## 2018-03-16 DIAGNOSIS — M722 Plantar fascial fibromatosis: Secondary | ICD-10-CM | POA: Diagnosis not present

## 2018-03-16 DIAGNOSIS — G8929 Other chronic pain: Secondary | ICD-10-CM | POA: Diagnosis not present

## 2018-03-16 DIAGNOSIS — M25561 Pain in right knee: Secondary | ICD-10-CM | POA: Diagnosis not present

## 2018-03-16 MED ORDER — LIDOCAINE HCL 1 % IJ SOLN
5.0000 mL | INTRAMUSCULAR | Status: AC | PRN
  Administered 2018-03-16: 5 mL

## 2018-03-16 MED ORDER — METHYLPREDNISOLONE ACETATE 40 MG/ML IJ SUSP
40.0000 mg | INTRAMUSCULAR | Status: AC | PRN
  Administered 2018-03-16: 40 mg via INTRA_ARTICULAR

## 2018-03-16 NOTE — Progress Notes (Signed)
Office Visit Note   Patient: Mackenzie CapersDeborah A Cox           Date of Birth: 1961/01/27           MRN: [id number] Visit Date: 03/10/2018              Requested by: Juluis RainierBarnes, Elizabeth, MD [address] [place], [place] [address] PCP: Juluis RainierBarnes, Elizabeth, MD  Chief Complaint  Patient presents with  . Right Knee - Pain      HPI: The patient is a 57 year old woman who presents today complaining of return of discomfort in her right knee this is been ongoing for almost a month now, reports good relief of symptoms with last injection in September of last year. She complains of fullness discomfort with flexing and intermittent swelling. She's been icing and using anti-inflammatories without relief.  Has been trying topical creams as well with minimal relief  Assessment & Plan: Visit Diagnoses:  1. Chronic pain of right knee   2. Right knee pain, unspecified chronicity     Plan: Depo-Medrol injection today she'll follow-up in office as needed  Follow-Up Instructions: Return if symptoms worsen or fail to improve.   Right Knee Exam   Muscle Strength  The patient has normal right knee strength.  Tenderness  The patient is experiencing tenderness in the medial joint line.  Range of Motion  The patient has normal right knee ROM.  Tests  Varus: negative Valgus: negative  Other  Erythema: absent Swelling: moderate Effusion: no effusion present      Patient is alert, oriented, no adenopathy, well-dressed, normal affect, normal respiratory effort.   Imaging: No results found. No images are attached to the encounter.  Labs: No results found for: HGBA1C, ESRSEDRATE, CRP, LABURIC, REPTSTATUS, GRAMSTAIN, CULT, LABORGA  Orders:  Orders Placed This Encounter  Procedures  . XR Knee 1-2 Views Right   No orders of the defined types were placed in this encounter.    Procedures: Large Joint Inj: R knee on 03/16/2018 2:24 PM Indications: pain Details: 18 G 1.5 in needle,  anteromedial approach Medications: 5 mL lidocaine 1 %; 40 mg methylPREDNISolone acetate 40 MG/ML Consent was given by the patient.      Clinical Data: No additional findings.  ROS:  All other systems negative, except as noted in the HPI. Review of Systems  Constitutional: Negative for chills and fever.  Musculoskeletal: Positive for arthralgias and joint swelling.  Neurological: Negative for weakness and numbness.    Objective: Vital Signs: Ht 5\' 6"  (1.676 m)   Wt 180 lb (81.6 kg)   BMI 29.05 kg/m   Specialty Comments:  No specialty comments available.  PMFS History: Patient Active Problem List   Diagnosis Date Noted  . Prepatellar bursitis of right knee 07/21/2016  . Menorrhagia 08/23/2012  . Anemia 08/23/2012   Past Medical History:  Diagnosis Date  . Anemia     History reviewed. No pertinent family history.  Past Surgical History:  Procedure Laterality Date  . CESAREAN SECTION  1990  . CHOLECYSTECTOMY  1985  . DILITATION & CURRETTAGE/HYSTROSCOPY WITH HYDROTHERMAL ABLATION  08/23/2012   Procedure: DILATATION & CURETTAGE/HYSTEROSCOPY WITH HYDROTHERMAL ABLATION;  Surgeon: Dorien Chihuahuaara J. Richardson Doppole, MD;  Location: [facility];  Service: Gynecology;  Laterality: N/A;   Social History   Occupational History  . Not on file  Tobacco Use  . Smoking status: Never Smoker  . Smokeless tobacco: Never Used  Substance and Sexual Activity  . Alcohol use: Yes  Comment: social  . Drug use: No  . Sexual activity: Never

## 2018-03-17 ENCOUNTER — Ambulatory Visit (INDEPENDENT_AMBULATORY_CARE_PROVIDER_SITE_OTHER): Payer: 59 | Admitting: Family

## 2018-03-18 DIAGNOSIS — M9903 Segmental and somatic dysfunction of lumbar region: Secondary | ICD-10-CM | POA: Diagnosis not present

## 2018-03-18 DIAGNOSIS — M9904 Segmental and somatic dysfunction of sacral region: Secondary | ICD-10-CM | POA: Diagnosis not present

## 2018-03-18 DIAGNOSIS — M9905 Segmental and somatic dysfunction of pelvic region: Secondary | ICD-10-CM | POA: Diagnosis not present

## 2018-04-16 DIAGNOSIS — M9903 Segmental and somatic dysfunction of lumbar region: Secondary | ICD-10-CM | POA: Diagnosis not present

## 2018-04-16 DIAGNOSIS — M9904 Segmental and somatic dysfunction of sacral region: Secondary | ICD-10-CM | POA: Diagnosis not present

## 2018-04-16 DIAGNOSIS — M9905 Segmental and somatic dysfunction of pelvic region: Secondary | ICD-10-CM | POA: Diagnosis not present

## 2018-05-12 DIAGNOSIS — M9905 Segmental and somatic dysfunction of pelvic region: Secondary | ICD-10-CM | POA: Diagnosis not present

## 2018-05-12 DIAGNOSIS — M9903 Segmental and somatic dysfunction of lumbar region: Secondary | ICD-10-CM | POA: Diagnosis not present

## 2018-05-12 DIAGNOSIS — M9904 Segmental and somatic dysfunction of sacral region: Secondary | ICD-10-CM | POA: Diagnosis not present

## 2018-05-19 DIAGNOSIS — Z23 Encounter for immunization: Secondary | ICD-10-CM | POA: Diagnosis not present

## 2018-05-21 DIAGNOSIS — Z1231 Encounter for screening mammogram for malignant neoplasm of breast: Secondary | ICD-10-CM | POA: Diagnosis not present

## 2018-06-09 DIAGNOSIS — M9903 Segmental and somatic dysfunction of lumbar region: Secondary | ICD-10-CM | POA: Diagnosis not present

## 2018-06-09 DIAGNOSIS — M9904 Segmental and somatic dysfunction of sacral region: Secondary | ICD-10-CM | POA: Diagnosis not present

## 2018-06-09 DIAGNOSIS — M9905 Segmental and somatic dysfunction of pelvic region: Secondary | ICD-10-CM | POA: Diagnosis not present

## 2018-07-01 ENCOUNTER — Other Ambulatory Visit (HOSPITAL_COMMUNITY)
Admission: RE | Admit: 2018-07-01 | Discharge: 2018-07-01 | Disposition: A | Discharge status: Home / Self Care | Payer: 59 | Source: Ambulatory Visit | Attending: Family Medicine | Admitting: Family Medicine

## 2018-07-01 ENCOUNTER — Other Ambulatory Visit: Payer: Self-pay | Admitting: Family Medicine

## 2018-07-01 DIAGNOSIS — Z124 Encounter for screening for malignant neoplasm of cervix: Secondary | ICD-10-CM | POA: Insufficient documentation

## 2018-07-01 DIAGNOSIS — Z1159 Encounter for screening for other viral diseases: Secondary | ICD-10-CM | POA: Diagnosis not present

## 2018-07-01 DIAGNOSIS — Z1211 Encounter for screening for malignant neoplasm of colon: Secondary | ICD-10-CM | POA: Diagnosis not present

## 2018-07-01 DIAGNOSIS — Z862 Personal history of diseases of the blood and blood-forming organs and certain disorders involving the immune mechanism: Secondary | ICD-10-CM | POA: Diagnosis not present

## 2018-07-01 DIAGNOSIS — Z0000 Encounter for general adult medical examination without abnormal findings: Secondary | ICD-10-CM | POA: Diagnosis not present

## 2018-07-01 DIAGNOSIS — Z13220 Encounter for screening for lipoid disorders: Secondary | ICD-10-CM | POA: Diagnosis not present

## 2018-07-02 LAB — CYTOLOGY - PAP: DIAGNOSIS: NEGATIVE

## 2018-07-09 DIAGNOSIS — M9903 Segmental and somatic dysfunction of lumbar region: Secondary | ICD-10-CM | POA: Diagnosis not present

## 2018-07-09 DIAGNOSIS — M9905 Segmental and somatic dysfunction of pelvic region: Secondary | ICD-10-CM | POA: Diagnosis not present

## 2018-07-09 DIAGNOSIS — M9904 Segmental and somatic dysfunction of sacral region: Secondary | ICD-10-CM | POA: Diagnosis not present

## 2018-08-05 DIAGNOSIS — Z23 Encounter for immunization: Secondary | ICD-10-CM | POA: Diagnosis not present

## 2018-08-13 DIAGNOSIS — M9905 Segmental and somatic dysfunction of pelvic region: Secondary | ICD-10-CM | POA: Diagnosis not present

## 2018-08-13 DIAGNOSIS — M9903 Segmental and somatic dysfunction of lumbar region: Secondary | ICD-10-CM | POA: Diagnosis not present

## 2018-08-13 DIAGNOSIS — M9904 Segmental and somatic dysfunction of sacral region: Secondary | ICD-10-CM | POA: Diagnosis not present

## 2018-09-10 DIAGNOSIS — M9905 Segmental and somatic dysfunction of pelvic region: Secondary | ICD-10-CM | POA: Diagnosis not present

## 2018-09-10 DIAGNOSIS — M9904 Segmental and somatic dysfunction of sacral region: Secondary | ICD-10-CM | POA: Diagnosis not present

## 2018-09-10 DIAGNOSIS — M9903 Segmental and somatic dysfunction of lumbar region: Secondary | ICD-10-CM | POA: Diagnosis not present

## 2018-10-05 DIAGNOSIS — M71571 Other bursitis, not elsewhere classified, right ankle and foot: Secondary | ICD-10-CM | POA: Diagnosis not present

## 2018-10-05 DIAGNOSIS — M722 Plantar fascial fibromatosis: Secondary | ICD-10-CM | POA: Diagnosis not present

## 2018-10-05 DIAGNOSIS — M7731 Calcaneal spur, right foot: Secondary | ICD-10-CM | POA: Diagnosis not present

## 2018-10-08 DIAGNOSIS — M9905 Segmental and somatic dysfunction of pelvic region: Secondary | ICD-10-CM | POA: Diagnosis not present

## 2018-10-08 DIAGNOSIS — M9903 Segmental and somatic dysfunction of lumbar region: Secondary | ICD-10-CM | POA: Diagnosis not present

## 2018-10-08 DIAGNOSIS — M9904 Segmental and somatic dysfunction of sacral region: Secondary | ICD-10-CM | POA: Diagnosis not present

## 2018-11-05 DIAGNOSIS — M9905 Segmental and somatic dysfunction of pelvic region: Secondary | ICD-10-CM | POA: Diagnosis not present

## 2018-11-05 DIAGNOSIS — M9904 Segmental and somatic dysfunction of sacral region: Secondary | ICD-10-CM | POA: Diagnosis not present

## 2018-11-05 DIAGNOSIS — M9903 Segmental and somatic dysfunction of lumbar region: Secondary | ICD-10-CM | POA: Diagnosis not present

## 2018-12-23 DIAGNOSIS — H33321 Round hole, right eye: Secondary | ICD-10-CM | POA: Diagnosis not present

## 2018-12-23 DIAGNOSIS — H43813 Vitreous degeneration, bilateral: Secondary | ICD-10-CM | POA: Diagnosis not present

## 2018-12-23 DIAGNOSIS — H33021 Retinal detachment with multiple breaks, right eye: Secondary | ICD-10-CM | POA: Diagnosis not present

## 2018-12-23 DIAGNOSIS — H33011 Retinal detachment with single break, right eye: Secondary | ICD-10-CM | POA: Diagnosis not present

## 2018-12-23 DIAGNOSIS — H35413 Lattice degeneration of retina, bilateral: Secondary | ICD-10-CM | POA: Diagnosis not present

## 2018-12-24 DIAGNOSIS — H33021 Retinal detachment with multiple breaks, right eye: Secondary | ICD-10-CM | POA: Diagnosis not present

## 2018-12-24 DIAGNOSIS — E669 Obesity, unspecified: Secondary | ICD-10-CM | POA: Diagnosis not present

## 2018-12-24 DIAGNOSIS — Z6832 Body mass index (BMI) 32.0-32.9, adult: Secondary | ICD-10-CM | POA: Diagnosis not present

## 2018-12-30 DIAGNOSIS — H33021 Retinal detachment with multiple breaks, right eye: Secondary | ICD-10-CM | POA: Diagnosis not present

## 2019-01-13 DIAGNOSIS — H35341 Macular cyst, hole, or pseudohole, right eye: Secondary | ICD-10-CM | POA: Diagnosis not present

## 2019-02-03 DIAGNOSIS — H2511 Age-related nuclear cataract, right eye: Secondary | ICD-10-CM | POA: Diagnosis not present

## 2019-04-29 ENCOUNTER — Ambulatory Visit: Payer: Self-pay

## 2019-04-29 ENCOUNTER — Ambulatory Visit (INDEPENDENT_AMBULATORY_CARE_PROVIDER_SITE_OTHER): Payer: 59 | Admitting: Family

## 2019-04-29 ENCOUNTER — Encounter: Payer: Self-pay | Admitting: Family

## 2019-04-29 VITALS — Ht 66.0 in | Wt 180.0 lb

## 2019-04-29 DIAGNOSIS — M25561 Pain in right knee: Secondary | ICD-10-CM | POA: Diagnosis not present

## 2019-04-29 DIAGNOSIS — M79672 Pain in left foot: Secondary | ICD-10-CM

## 2019-04-29 DIAGNOSIS — M25562 Pain in left knee: Secondary | ICD-10-CM | POA: Diagnosis not present

## 2019-05-11 ENCOUNTER — Encounter: Payer: Self-pay | Admitting: Family

## 2019-05-11 NOTE — Progress Notes (Signed)
Office Visit Note   Patient: Mackenzie Cox           Date of Birth: 1961/06/14           MRN: [id number] Visit Date: 04/29/2019              Requested by: Leighton Ruff, [place],  [place] [address] PCP: Leighton Ruff, MD  Chief Complaint  Patient presents with  . Left Foot - Pain  . Right Knee - Pain  . Left Knee - Pain      HPI: The patient is a 58 year old woman who is seen today for evaluation of bilateral knee and thigh pain as well as some left little toe pain she slipped and fell last night and her legs bent up underneath her to the left side.  She has some bruising to her thigh knee and foot.  Pain with extension of bilateral knees.  She feels a pulling in the anterior thigh with range of motion of her knee.  She is not had any increased swelling.  She is using ibuprofen and ice with minimal relief.  Assessment & Plan: Visit Diagnoses:  1. Acute pain of both knees   2. Pain in left foot     Plan: Radiographs are reassuring.  Discussed she likely has some soft tissue injury.  Continue using ice she can use anti-inflammatories for her pain.  Encouraged her that this will likely resolve with conservative measures.  Follow-Up Instructions: Return in about 3 weeks (around 05/20/2019), or if symptoms worsen or fail to improve.   Left Ankle Exam   Tenderness  The patient is experiencing no tenderness.  Swelling: none  Range of Motion  The patient has normal left ankle ROM.   Other  Pulse: present  Comments:  Ecchymosis to the fifth toe.  Able to wiggle it.  Normothermic.   Right Knee Exam   Muscle Strength  The patient has normal right knee strength.  Tenderness  The patient is experiencing no tenderness.   Range of Motion  The patient has normal right knee ROM.  Tests  Varus: negative Valgus: negative  Other  Erythema: absent Effusion: no effusion present   Left Knee Exam   Muscle Strength  The patient has normal  left knee strength.  Tenderness  The patient is experiencing no tenderness.   Range of Motion  The patient has normal left knee ROM.  Tests  Varus: negative Valgus: negative  Other  Erythema: absent Effusion: no effusion present      Patient is alert, oriented, no adenopathy, well-dressed, normal affect, normal respiratory effort.   Imaging: No results found. No images are attached to the encounter.  Labs: No results found for: HGBA1C, ESRSEDRATE, CRP, LABURIC, REPTSTATUS, GRAMSTAIN, CULT, LABORGA   No results found for: ALBUMIN, PREALBUMIN, LABURIC  No results found for: MG No results found for: VD25OH  No results found for: PREALBUMIN CBC EXTENDED Latest Ref Rng & Units 08/23/2012  WBC 4.0 - 10.5 K/uL 5.1  RBC 3.87 - 5.11 MIL/uL 4.20  HGB 12.0 - 15.0 g/dL 10.0(L)  HCT 36.0 - 46.0 % 33.2(L)  PLT 150 - 400 K/uL 367     Body mass index is 29.05 kg/m.  Orders:  Orders Placed This Encounter  Procedures  . XR Knee 1-2 Views Right  . XR Knee 1-2 Views Left  . XR Foot 2 Views Left   No orders of the defined types were placed in this encounter.  Procedures: No procedures performed  Clinical Data: No additional findings.  ROS:  All other systems negative, except as noted in the HPI. Review of Systems  Constitutional: Negative for chills and fever.  Musculoskeletal: Positive for arthralgias and myalgias. Negative for gait problem and joint swelling.  Skin: Negative for wound.    Objective: Vital Signs: Ht 5\' 6"  (1.676 m)   Wt 180 lb (81.6 kg)   BMI 29.05 kg/m   Specialty Comments:  No specialty comments available.  PMFS History: Patient Active Problem List   Diagnosis Date Noted  . Prepatellar bursitis of right knee 07/21/2016  . Menorrhagia 08/23/2012  . Anemia 08/23/2012   Past Medical History:  Diagnosis Date  . Anemia     History reviewed. No pertinent family history.  Past Surgical History:  Procedure Laterality Date  .  CESAREAN SECTION  1990  . CHOLECYSTECTOMY  1985  . DILITATION & CURRETTAGE/HYSTROSCOPY WITH HYDROTHERMAL ABLATION  08/23/2012   Procedure: DILATATION & CURETTAGE/HYSTEROSCOPY WITH HYDROTHERMAL ABLATION;  Surgeon: Dorien Chihuahuaara J. Richardson Doppole, MD;  Location: [facility];  Service: Gynecology;  Laterality: N/A;   Social History   Occupational History  . Not on file  Tobacco Use  . Smoking status: Never Smoker  . Smokeless tobacco: Never Used  Substance and Sexual Activity  . Alcohol use: Yes    Comment: social  . Drug use: No  . Sexual activity: Never

## 2019-09-02 ENCOUNTER — Encounter: Payer: Self-pay | Admitting: Family

## 2019-09-02 ENCOUNTER — Other Ambulatory Visit: Payer: Self-pay

## 2019-09-02 ENCOUNTER — Ambulatory Visit (INDEPENDENT_AMBULATORY_CARE_PROVIDER_SITE_OTHER): Payer: 59 | Admitting: Family

## 2019-09-02 VITALS — Ht 66.0 in | Wt 180.0 lb

## 2019-09-02 DIAGNOSIS — G8929 Other chronic pain: Secondary | ICD-10-CM

## 2019-09-02 DIAGNOSIS — M25561 Pain in right knee: Secondary | ICD-10-CM

## 2019-09-02 MED ORDER — LIDOCAINE HCL 1 % IJ SOLN
5.0000 mL | INTRAMUSCULAR | Status: AC | PRN
  Administered 2019-09-02: 11:00:00 5 mL

## 2019-09-02 MED ORDER — METHYLPREDNISOLONE ACETATE 40 MG/ML IJ SUSP
40.0000 mg | INTRAMUSCULAR | Status: AC | PRN
  Administered 2019-09-02: 40 mg via INTRA_ARTICULAR

## 2019-09-02 NOTE — Progress Notes (Signed)
Office Visit Note   Patient: Mackenzie Cox           Date of Birth: [birth year]           MRN: [id number] Visit Date: 09/02/2019              Requested by: Leighton Ruff, [place],  [place] [address] PCP: Leighton Ruff, MD  Chief Complaint  Patient presents with  . Right Knee - Follow-up, Pain    Cortisone Inj preferred RKnee      HPI: The patient is a 58 year old woman who presents today complaining of return of discomfort in her right knee this is been ongoing weeks now. reports good relief of symptoms with last injection which was quite some time ago. She complains of fullness and tightness, discomfort with flexing and intermittent swelling. She's been icing and using anti-inflammatories without relief.    Assessment & Plan: Visit Diagnoses:  1. Chronic pain of right knee     Plan: Depo-Medrol injection today, tolerated well. she'll follow-up in office as needed  Follow-Up Instructions: Return if symptoms worsen or fail to improve.   Right Knee Exam   Muscle Strength  The patient has normal right knee strength.  Tenderness  The patient is experiencing tenderness in the medial joint line.  Range of Motion  The patient has normal right knee ROM.  Tests  Varus: negative Valgus: negative  Other  Erythema: absent Swelling: moderate Effusion: no effusion present      Patient is alert, oriented, no adenopathy, well-dressed, normal affect, normal respiratory effort.   Imaging: No results found. No images are attached to the encounter.  Labs: No results found for: HGBA1C, ESRSEDRATE, CRP, LABURIC, REPTSTATUS, GRAMSTAIN, CULT, LABORGA  Orders:  No orders of the defined types were placed in this encounter.  No orders of the defined types were placed in this encounter.    Procedures: Large Joint Inj: R knee on 09/02/2019 11:26 AM Indications: pain Details: 18 G 1.5 in needle, anteromedial approach Medications: 5 mL  lidocaine 1 %; 40 mg methylPREDNISolone acetate 40 MG/ML Consent was given by the patient.      Clinical Data: No additional findings.  ROS:  All other systems negative, except as noted in the HPI. Review of Systems  Constitutional: Negative for chills and fever.  Musculoskeletal: Positive for arthralgias and joint swelling.  Neurological: Negative for weakness and numbness.    Objective: Vital Signs: Ht 5\' 6"  (1.676 m)   Wt 180 lb (81.6 kg)   BMI 29.05 kg/m   Specialty Comments:  No specialty comments available.  PMFS History: Patient Active Problem List   Diagnosis Date Noted  . Prepatellar bursitis of right knee 07/21/2016  . Menorrhagia 08/23/2012  . Anemia 08/23/2012   Past Medical History:  Diagnosis Date  . Anemia     History reviewed. No pertinent family history.  Past Surgical History:  Procedure Laterality Date  . CESAREAN SECTION  1990  . CHOLECYSTECTOMY  1985  . DILITATION & CURRETTAGE/HYSTROSCOPY WITH HYDROTHERMAL ABLATION  08/23/2012   Procedure: DILATATION & CURETTAGE/HYSTEROSCOPY WITH HYDROTHERMAL ABLATION;  Surgeon: Maeola Sarah. Landry Mellow, MD;  Location: [facility];  Service: Gynecology;  Laterality: N/A;   Social History   Occupational History  . Not on file  Tobacco Use  . Smoking status: Never Smoker  . Smokeless tobacco: Never Used  Substance and Sexual Activity  . Alcohol use: Yes    Comment: social  . Drug use: No  .  Sexual activity: Never

## 2019-09-20 ENCOUNTER — Encounter: Payer: Self-pay | Admitting: Family

## 2019-09-20 ENCOUNTER — Ambulatory Visit (INDEPENDENT_AMBULATORY_CARE_PROVIDER_SITE_OTHER): Payer: 59 | Admitting: Family

## 2019-09-20 ENCOUNTER — Other Ambulatory Visit: Payer: Self-pay

## 2019-09-20 VITALS — Ht 66.0 in | Wt 180.0 lb

## 2019-09-20 DIAGNOSIS — M25561 Pain in right knee: Secondary | ICD-10-CM | POA: Diagnosis not present

## 2019-09-20 DIAGNOSIS — G8929 Other chronic pain: Secondary | ICD-10-CM | POA: Diagnosis not present

## 2019-09-21 ENCOUNTER — Encounter: Payer: Self-pay | Admitting: Family

## 2019-09-21 NOTE — Progress Notes (Signed)
   Office Visit Note   Patient: Mackenzie Cox           Date of Birth: 1961/08/13           MRN: [id number] Visit Date: 09/20/2019              Requested by: Leighton Ruff, [place],  [place] [address] PCP: Leighton Ruff, MD  Chief Complaint  Patient presents with  . Right Knee - Pain      HPI: The patient is a 58 year old woman who presents today complaining fullness and tightness to the right knee. Had depomedrol injection 2 weeks ago. Did not get the expected relief.   She had been having pain for weeks now. She complains of fullness and tightness, discomfort with flexing and intermittent swelling. She's been icing and using anti-inflammatories without relief.    Assessment & Plan: Visit Diagnoses:  1. Chronic pain of right knee     Plan: ace or knee sleeve for swelling. Ice. May use tylenol. Follow up as needed.  Follow-Up Instructions: Return if symptoms worsen or fail to improve.   Right Knee Exam   Muscle Strength  The patient has normal right knee strength.  Tenderness  The patient is experiencing tenderness in the medial joint line.  Range of Motion  The patient has normal right knee ROM.  Tests  Varus: negative Valgus: negative  Other  Erythema: absent Swelling: moderate Effusion: no effusion present      Patient is alert, oriented, no adenopathy, well-dressed, normal affect, normal respiratory effort.   Imaging: No results found. No images are attached to the encounter.  Labs: No results found for: HGBA1C, ESRSEDRATE, CRP, LABURIC, REPTSTATUS, GRAMSTAIN, CULT, LABORGA  Orders:  No orders of the defined types were placed in this encounter.  No orders of the defined types were placed in this encounter.    Procedures: No procedures performed  Clinical Data: No additional findings.  ROS:  All other systems negative, except as noted in the HPI. Review of Systems  Constitutional: Negative for chills and  fever.  Musculoskeletal: Positive for arthralgias and joint swelling.  Neurological: Negative for weakness and numbness.    Objective: Vital Signs: Ht 5\' 6"  (1.676 m)   Wt 180 lb (81.6 kg)   BMI 29.05 kg/m   Specialty Comments:  No specialty comments available.  PMFS History: Patient Active Problem List   Diagnosis Date Noted  . Prepatellar bursitis of right knee 07/21/2016  . Menorrhagia 08/23/2012  . Anemia 08/23/2012   Past Medical History:  Diagnosis Date  . Anemia     History reviewed. No pertinent family history.  Past Surgical History:  Procedure Laterality Date  . CESAREAN SECTION  1990  . CHOLECYSTECTOMY  1985  . DILITATION & CURRETTAGE/HYSTROSCOPY WITH HYDROTHERMAL ABLATION  08/23/2012   Procedure: DILATATION & CURETTAGE/HYSTEROSCOPY WITH HYDROTHERMAL ABLATION;  Surgeon: Maeola Sarah. Landry Mellow, MD;  Location: [facility];  Service: Gynecology;  Laterality: N/A;   Social History   Occupational History  . Not on file  Tobacco Use  . Smoking status: Never Smoker  . Smokeless tobacco: Never Used  Substance and Sexual Activity  . Alcohol use: Yes    Comment: social  . Drug use: No  . Sexual activity: Never

## 2020-08-10 ENCOUNTER — Ambulatory Visit: Payer: 59 | Admitting: Family

## 2020-08-15 ENCOUNTER — Ambulatory Visit (INDEPENDENT_AMBULATORY_CARE_PROVIDER_SITE_OTHER): Payer: 59 | Admitting: Family

## 2020-08-15 ENCOUNTER — Encounter: Payer: Self-pay | Admitting: Family

## 2020-08-15 VITALS — Ht 66.0 in | Wt 180.0 lb

## 2020-08-15 DIAGNOSIS — M25561 Pain in right knee: Secondary | ICD-10-CM

## 2020-08-15 DIAGNOSIS — M1711 Unilateral primary osteoarthritis, right knee: Secondary | ICD-10-CM | POA: Diagnosis not present

## 2020-08-15 DIAGNOSIS — G8929 Other chronic pain: Secondary | ICD-10-CM

## 2020-08-15 MED ORDER — LIDOCAINE HCL 1 % IJ SOLN
5.0000 mL | INTRAMUSCULAR | Status: AC | PRN
  Administered 2020-08-15: 5 mL

## 2020-08-15 MED ORDER — METHYLPREDNISOLONE ACETATE 40 MG/ML IJ SUSP
40.0000 mg | INTRAMUSCULAR | Status: AC | PRN
  Administered 2020-08-15: 40 mg via INTRA_ARTICULAR

## 2020-08-15 NOTE — Progress Notes (Signed)
Office Visit Note   Patient: Mackenzie Cox           Date of Birth: [birth year]           MRN: [id number] Visit Date: 08/15/2020              Requested by: Juluis Rainier, MD [address] [place],  [place] [address] PCP: Juluis Rainier, MD  Chief Complaint  Patient presents with  . Right Knee - Pain    S/p cortisone injection 08/2019      HPI: The patient is a 59 year old woman who presents today complaining worsening aching and tightness to the right knee. Had last depomedrol injection nearly one year ago. Relief lasted until recent weeks.  She had been having pain for weeks now. Pain with start up and prolonged weight bearing. Typical pain of her previous arthritic pain. using anti-inflammatories without relief.    Assessment & Plan: Visit Diagnoses:  1. Chronic pain of right knee   2. Primary osteoarthritis of right knee     Plan: depomedrol injection of right knee. Tolerated well. Follow up as needed.  Follow-Up Instructions: Return if symptoms worsen or fail to improve.   Right Knee Exam   Muscle Strength  The patient has normal right knee strength.  Tenderness  The patient is experiencing tenderness in the medial joint line.  Range of Motion  The patient has normal right knee ROM.  Tests  Varus: negative Valgus: negative  Other  Erythema: absent Swelling: moderate Effusion: no effusion present      Patient is alert, oriented, no adenopathy, well-dressed, normal affect, normal respiratory effort.   Imaging: No results found. No images are attached to the encounter.  Labs: No results found for: HGBA1C, ESRSEDRATE, CRP, LABURIC, REPTSTATUS, GRAMSTAIN, CULT, LABORGA  Orders:  No orders of the defined types were placed in this encounter.  No orders of the defined types were placed in this encounter.    Procedures: Large Joint Inj: R knee on 08/15/2020 9:25 AM Indications: pain and diagnostic evaluation Details: 22 G 1.5 in  needle  Arthrogram: No  Medications: 40 mg methylPREDNISolone acetate 40 MG/ML; 5 mL lidocaine 1 % Outcome: tolerated well, no immediate complications Procedure, treatment alternatives, risks and benefits explained, specific risks discussed. Consent was given by the patient. Immediately prior to procedure a time out was called to verify the correct patient, procedure, equipment, support staff and site/side marked as required. Patient was prepped and draped in the usual sterile fashion.      Clinical Data: No additional findings.  ROS:  All other systems negative, except as noted in the HPI. Review of Systems  Constitutional: Negative for chills and fever.  Musculoskeletal: Positive for arthralgias and joint swelling.  Neurological: Negative for weakness and numbness.    Objective: Vital Signs: Ht 5\' 6"  (1.676 m)   Wt 180 lb (81.6 kg)   BMI 29.05 kg/m   Specialty Comments:  No specialty comments available.  PMFS History: Patient Active Problem List   Diagnosis Date Noted  . Prepatellar bursitis of right knee 07/21/2016  . Menorrhagia 08/23/2012  . Anemia 08/23/2012   Past Medical History:  Diagnosis Date  . Anemia     History reviewed. No pertinent family history.  Past Surgical History:  Procedure Laterality Date  . CESAREAN SECTION  1990  . CHOLECYSTECTOMY  1985  . DILITATION & CURRETTAGE/HYSTROSCOPY WITH HYDROTHERMAL ABLATION  08/23/2012   Procedure: DILATATION & CURETTAGE/HYSTEROSCOPY WITH HYDROTHERMAL ABLATION;  Surgeon: 14/10/2011  Queen Slough, MD;  Location: [facility];  Service: Gynecology;  Laterality: N/A;   Social History   Occupational History  . Not on file  Tobacco Use  . Smoking status: Never Smoker  . Smokeless tobacco: Never Used  Substance and Sexual Activity  . Alcohol use: Yes    Comment: social  . Drug use: No  . Sexual activity: Never

## 2020-08-28 ENCOUNTER — Encounter: Payer: Self-pay | Admitting: Orthopedic Surgery

## 2020-08-28 ENCOUNTER — Ambulatory Visit (INDEPENDENT_AMBULATORY_CARE_PROVIDER_SITE_OTHER): Payer: 59 | Admitting: Physician Assistant

## 2020-08-28 VITALS — Ht 66.0 in | Wt 180.0 lb

## 2020-08-28 DIAGNOSIS — M25562 Pain in left knee: Secondary | ICD-10-CM

## 2020-08-28 MED ORDER — LIDOCAINE HCL 1 % IJ SOLN
1.0000 mL | INTRAMUSCULAR | Status: AC | PRN
  Administered 2020-08-28: 1 mL

## 2020-08-28 MED ORDER — METHYLPREDNISOLONE ACETATE 40 MG/ML IJ SUSP
40.0000 mg | INTRAMUSCULAR | Status: AC | PRN
  Administered 2020-08-28: 40 mg via INTRA_ARTICULAR

## 2020-08-28 NOTE — Progress Notes (Signed)
Office Visit Note   Patient: Mackenzie Cox           Date of Birth: [birth year]           MRN: [id number] Visit Date: 08/28/2020              Requested by: Juluis Rainier, MD [address] [place],  [place] [address] PCP: Juluis Rainier, MD  Chief Complaint  Patient presents with  . Right Knee - Follow-up    S/p cortisone injection 08/15/20  . Left Knee - Pain      HPI: This is a pleasant woman who is status post right knee injection at the end of November.  She is doing much better.  She is also having some pain in her left knee and is wondering if she can have the same injection  Assessment & Plan: Visit Diagnoses: No diagnosis found.  Plan: We will go forward with an injection into her left knee today.  Also demonstrated quadricep strengthening exercises to her.  She may follow-up as needed.  Follow-Up Instructions: No follow-ups on file.   Ortho Exam  Patient is alert, oriented, no adenopathy, well-dressed, normal affect, normal respiratory effort. Focused examination of her left knee no cellulitis no effusion.  She has good range of motion some mild patellar grind.  She is tender over the medial joint line review of x-rays show some mild varus alignment.  Imaging: No results found. No images are attached to the encounter.  Labs: No results found for: HGBA1C, ESRSEDRATE, CRP, LABURIC, REPTSTATUS, GRAMSTAIN, CULT, LABORGA   No results found for: ALBUMIN, PREALBUMIN, LABURIC  No results found for: MG No results found for: VD25OH  No results found for: PREALBUMIN CBC EXTENDED Latest Ref Rng & Units 08/23/2012  WBC 4.0 - 10.5 K/uL 5.1  RBC 3.87 - 5.11 MIL/uL 4.20  HGB 12.0 - 15.0 g/dL 10.0(L)  HCT 36 - 46 % 33.2(L)  PLT 150 - 400 K/uL 367     Body mass index is 29.05 kg/m.  Orders:  No orders of the defined types were placed in this encounter.  No orders of the defined types were placed in this encounter.    Procedures: Large Joint Inj:  L knee on 08/28/2020 2:34 PM Indications: pain and diagnostic evaluation Details: 22 G 1.5 in needle, anteromedial approach  Arthrogram: No  Medications: 40 mg methylPREDNISolone acetate 40 MG/ML; 1 mL lidocaine 1 % Outcome: tolerated well, no immediate complications Procedure, treatment alternatives, risks and benefits explained, specific risks discussed. Consent was given by the patient.      Clinical Data: No additional findings.  ROS:  All other systems negative, except as noted in the HPI. Review of Systems  Objective: Vital Signs: Ht 5\' 6"  (1.676 m)   Wt 180 lb (81.6 kg)   BMI 29.05 kg/m   Specialty Comments:  No specialty comments available.  PMFS History: Patient Active Problem List   Diagnosis Date Noted  . Prepatellar bursitis of right knee 07/21/2016  . Menorrhagia 08/23/2012  . Anemia 08/23/2012   Past Medical History:  Diagnosis Date  . Anemia     No family history on file.  Past Surgical History:  Procedure Laterality Date  . CESAREAN SECTION  1990  . CHOLECYSTECTOMY  1985  . DILITATION & CURRETTAGE/HYSTROSCOPY WITH HYDROTHERMAL ABLATION  08/23/2012   Procedure: DILATATION & CURETTAGE/HYSTEROSCOPY WITH HYDROTHERMAL ABLATION;  Surgeon: 14/10/2011. Dorien Chihuahua, MD;  Location: [facility];  Service: Gynecology;  Laterality: N/A;  Social History   Occupational History  . Not on file  Tobacco Use  . Smoking status: Never Smoker  . Smokeless tobacco: Never Used  Substance and Sexual Activity  . Alcohol use: Yes    Comment: social  . Drug use: No  . Sexual activity: Never

## 2021-05-31 ENCOUNTER — Ambulatory Visit (INDEPENDENT_AMBULATORY_CARE_PROVIDER_SITE_OTHER): Payer: 59 | Admitting: Family

## 2021-05-31 DIAGNOSIS — M1711 Unilateral primary osteoarthritis, right knee: Secondary | ICD-10-CM | POA: Diagnosis not present

## 2021-05-31 MED ORDER — LIDOCAINE HCL 1 % IJ SOLN
5.0000 mL | INTRAMUSCULAR | Status: AC | PRN
  Administered 2021-05-31: 5 mL

## 2021-05-31 MED ORDER — METHYLPREDNISOLONE ACETATE 40 MG/ML IJ SUSP
40.0000 mg | INTRAMUSCULAR | Status: AC | PRN
  Administered 2021-05-31: 40 mg via INTRA_ARTICULAR

## 2021-05-31 NOTE — Progress Notes (Signed)
   Office Visit Note   Patient: Mackenzie Cox           Date of Birth: Dec 28, 1960           MRN: [id number] Visit Date: 05/31/2021              Requested by: Juluis Rainier, MD [address] [place],  [place] [address] PCP: Juluis Rainier, MD  Chief Complaint  Patient presents with   Right Knee - Pain      HPI: The patient is a 60 year old woman who presents today complaining worsening aching and tightness to the right knee. Last injection was 08/15/20. States has had relief until recently. Pleased with this.   She had been having pain for weeks now. Pain with start up and prolonged weight bearing. Typical pain of her previous arthritic pain. using anti-inflammatories without relief.    Assessment & Plan: Visit Diagnoses:  No diagnosis found.   Plan: today depomedrol injection of right knee. Tolerated well. Follow up as needed.  Follow-Up Instructions: No follow-ups on file.   Right Knee Exam   Muscle Strength  The patient has normal right knee strength.  Tenderness  The patient is experiencing tenderness in the medial joint line.  Range of Motion  The patient has normal right knee ROM.  Tests  Varus: negative Valgus: negative  Other  Erythema: absent Swelling: moderate Effusion: no effusion present     Patient is alert, oriented, no adenopathy, well-dressed, normal affect, normal respiratory effort.   Imaging: No results found. No images are attached to the encounter.  Labs: No results found for: HGBA1C, ESRSEDRATE, CRP, LABURIC, REPTSTATUS, GRAMSTAIN, CULT, LABORGA  Orders:  No orders of the defined types were placed in this encounter.  No orders of the defined types were placed in this encounter.    Procedures: Large Joint Inj: R knee on 05/31/2021 10:10 AM Indications: pain Details: 18 G 1.5 in needle, anteromedial approach Medications: 5 mL lidocaine 1 %; 40 mg methylPREDNISolone acetate 40 MG/ML Consent was given by the  patient.     Clinical Data: No additional findings.  ROS:  All other systems negative, except as noted in the HPI. Review of Systems  Constitutional:  Negative for chills and fever.  Musculoskeletal:  Positive for arthralgias and joint swelling.  Neurological:  Negative for weakness and numbness.   Objective: Vital Signs: There were no vitals taken for this visit.  Specialty Comments:  No specialty comments available.  PMFS History: Patient Active Problem List   Diagnosis Date Noted   Prepatellar bursitis of right knee 07/21/2016   Menorrhagia 08/23/2012   Anemia 08/23/2012   Past Medical History:  Diagnosis Date   Anemia     No family history on file.  Past Surgical History:  Procedure Laterality Date   CESAREAN SECTION  1990   CHOLECYSTECTOMY  1985   DILITATION & CURRETTAGE/HYSTROSCOPY WITH HYDROTHERMAL ABLATION  08/23/2012   Procedure: DILATATION & CURETTAGE/HYSTEROSCOPY WITH HYDROTHERMAL ABLATION;  Surgeon: Dorien Chihuahua. Richardson Dopp, MD;  Location: [facility];  Service: Gynecology;  Laterality: N/A;   Social History   Occupational History   Not on file  Tobacco Use   Smoking status: Never   Smokeless tobacco: Never  Substance and Sexual Activity   Alcohol use: Yes    Comment: social   Drug use: No   Sexual activity: Never

## 2021-06-10 ENCOUNTER — Ambulatory Visit (HOSPITAL_COMMUNITY)
Admission: RE | Admit: 2021-06-10 | Discharge: 2021-06-10 | Disposition: A | Discharge status: Home / Self Care | Payer: 59 | Source: Ambulatory Visit | Attending: Family Medicine | Admitting: Family Medicine

## 2021-06-10 ENCOUNTER — Other Ambulatory Visit (HOSPITAL_COMMUNITY): Payer: Self-pay | Admitting: Family Medicine

## 2021-06-10 ENCOUNTER — Other Ambulatory Visit: Payer: Self-pay

## 2021-06-10 DIAGNOSIS — M7989 Other specified soft tissue disorders: Secondary | ICD-10-CM

## 2021-06-10 DIAGNOSIS — M79669 Pain in unspecified lower leg: Secondary | ICD-10-CM | POA: Diagnosis present

## 2021-06-10 DIAGNOSIS — M79606 Pain in leg, unspecified: Secondary | ICD-10-CM

## 2021-09-04 ENCOUNTER — Ambulatory Visit (INDEPENDENT_AMBULATORY_CARE_PROVIDER_SITE_OTHER): Payer: 59 | Admitting: Family

## 2021-09-04 ENCOUNTER — Encounter: Payer: Self-pay | Admitting: Family

## 2021-09-04 ENCOUNTER — Other Ambulatory Visit: Payer: Self-pay

## 2021-09-04 DIAGNOSIS — M1711 Unilateral primary osteoarthritis, right knee: Secondary | ICD-10-CM

## 2021-09-04 MED ORDER — METHYLPREDNISOLONE ACETATE 40 MG/ML IJ SUSP
40.0000 mg | INTRAMUSCULAR | Status: AC | PRN
  Administered 2021-09-04: 16:00:00 40 mg via INTRA_ARTICULAR

## 2021-09-04 MED ORDER — LIDOCAINE HCL 1 % IJ SOLN
5.0000 mL | INTRAMUSCULAR | Status: AC | PRN
  Administered 2021-09-04: 16:00:00 5 mL

## 2021-09-04 NOTE — Progress Notes (Signed)
Office Visit Note   Patient: Mackenzie Cox           Date of Birth: 09-16-1961           MRN: [id number] Visit Date: 09/04/2021              Requested by: Soundra Pilon, FNP [address] [place],  [place] [address] PCP: Soundra Pilon, FNP  Chief Complaint  Patient presents with   Right Knee - Follow-up      HPI: The patient is a 60 year old woman who presents today complaining of return of her osteoarthritis symptoms and pain to her right knee.  She unfortunately ascended several flights of stairs over a month ago and this worsened her knee pain and the pain never eased off after this.  Denies any injury she has been having aching and tightness start up pain and stiffness no mechanical symptoms  Assessment & Plan: Visit Diagnoses: No diagnosis found.  Plan: Right knee injected with Depo-Medrol.  Patient tolerated well.  She will follow-up as needed.  Follow-Up Instructions: Return if symptoms worsen or fail to improve.   Right Knee Exam   Tenderness  The patient is experiencing tenderness in the medial joint line and lateral joint line.  Range of Motion  The patient has normal right knee ROM.  Tests  Varus: negative Valgus: negative  Other  Swelling: none Effusion: no effusion present     Patient is alert, oriented, no adenopathy, well-dressed, normal affect, normal respiratory effort.   Imaging: No results found. No images are attached to the encounter.  Labs: No results found for: HGBA1C, ESRSEDRATE, CRP, LABURIC, REPTSTATUS, GRAMSTAIN, CULT, LABORGA   No results found for: ALBUMIN, PREALBUMIN, CBC  No results found for: MG No results found for: VD25OH  No results found for: PREALBUMIN CBC EXTENDED Latest Ref Rng & Units 08/23/2012  WBC 4.0 - 10.5 K/uL 5.1  RBC 3.87 - 5.11 MIL/uL 4.20  HGB 12.0 - 15.0 g/dL 10.0(L)  HCT 36.0 - 46.0 % 33.2(L)  PLT 150 - 400 K/uL 367     There is no height or weight on file to calculate BMI.  Orders:   No orders of the defined types were placed in this encounter.  No orders of the defined types were placed in this encounter.    Procedures: Large Joint Inj: R knee on 09/04/2021 4:19 PM Indications: pain Details: 18 G 1.5 in needle, anteromedial approach Medications: 5 mL lidocaine 1 %; 40 mg methylPREDNISolone acetate 40 MG/ML Consent was given by the patient.     Clinical Data: No additional findings.  ROS:  All other systems negative, except as noted in the HPI. Review of Systems  Constitutional: Negative.   Musculoskeletal:  Positive for arthralgias and myalgias.   Objective: Vital Signs: There were no vitals taken for this visit.  Specialty Comments:  No specialty comments available.  PMFS History: Patient Active Problem List   Diagnosis Date Noted   Prepatellar bursitis of right knee 07/21/2016   Menorrhagia 08/23/2012   Anemia 08/23/2012   Past Medical History:  Diagnosis Date   Anemia     History reviewed. No pertinent family history.  Past Surgical History:  Procedure Laterality Date   CESAREAN SECTION  1990   CHOLECYSTECTOMY  1985   DILITATION & CURRETTAGE/HYSTROSCOPY WITH HYDROTHERMAL ABLATION  08/23/2012   Procedure: DILATATION & CURETTAGE/HYSTEROSCOPY WITH HYDROTHERMAL ABLATION;  Surgeon: Dorien Chihuahua. Richardson Dopp, MD;  Location: [facility];  Service: Gynecology;  Laterality:  N/A;   Social History   Occupational History   Not on file  Tobacco Use   Smoking status: Never   Smokeless tobacco: Never  Substance and Sexual Activity   Alcohol use: Yes    Comment: social   Drug use: No   Sexual activity: Never

## 2022-09-05 ENCOUNTER — Ambulatory Visit (INDEPENDENT_AMBULATORY_CARE_PROVIDER_SITE_OTHER): Payer: 59 | Admitting: Family

## 2022-09-05 DIAGNOSIS — M1711 Unilateral primary osteoarthritis, right knee: Secondary | ICD-10-CM

## 2022-09-10 ENCOUNTER — Encounter: Payer: Self-pay | Admitting: Family

## 2022-09-10 DIAGNOSIS — M1711 Unilateral primary osteoarthritis, right knee: Secondary | ICD-10-CM

## 2022-09-10 MED ORDER — LIDOCAINE HCL 1 % IJ SOLN
5.0000 mL | INTRAMUSCULAR | Status: AC | PRN
Start: 2022-09-10 — End: 2022-09-10
  Administered 2022-09-10: 5 mL

## 2022-09-10 NOTE — Progress Notes (Signed)
Office Visit Note   Patient: Mackenzie Cox           Date of Birth: 1961/09/02           MRN: [id number] Visit Date: 09/05/2022              Requested by: Soundra Pilon, FNP [address] [place],  [place] [address] PCP: Soundra Pilon, FNP  Chief Complaint  Patient presents with   Right Knee - Pain    S/p injection last year 09/04/2021      HPI: The patient is a 61 year old woman who presents today complaining of right knee pain.  Return of her arthritic symptoms mechanical symptoms as well as some grinding.  Her last Depo-Medrol injection provided her with great interval relief and was about 1 year ago.  She is requesting repeat injection  Assessment & Plan: Visit Diagnoses: No diagnosis found.  Plan: Right knee injected with Depo-Medrol.  Patient tolerated well.  She will follow-up as needed.  Follow-Up Instructions: No follow-ups on file.   Right Knee Exam   Tenderness  The patient is experiencing tenderness in the medial joint line and lateral joint line.  Range of Motion  The patient has normal right knee ROM.  Tests  Varus: negative Valgus: negative  Other  Swelling: none Effusion: no effusion present      Patient is alert, oriented, no adenopathy, well-dressed, normal affect, normal respiratory effort.   Imaging: No results found. No images are attached to the encounter.  Labs: No results found for: "HGBA1C", "ESRSEDRATE", "CRP", "LABURIC", "REPTSTATUS", "GRAMSTAIN", "CULT", "LABORGA"   No results found for: "ALBUMIN", "PREALBUMIN", "CBC"  No results found for: "MG" No results found for: "VD25OH"  No results found for: "PREALBUMIN"    Latest Ref Rng & Units 08/23/2012   12:06 PM  CBC EXTENDED  WBC 4.0 - 10.5 K/uL 5.1   RBC 3.87 - 5.11 MIL/uL 4.20   Hemoglobin 12.0 - 15.0 g/dL 29.5   HCT 18.8 - 41.6 % 33.2   Platelets 150 - 400 K/uL 367      There is no height or weight on file to calculate BMI.  Orders:  No orders of the  defined types were placed in this encounter.  No orders of the defined types were placed in this encounter.    Procedures: Large Joint Inj: R knee on 09/10/2022 3:27 PM Indications: pain Details: 18 G 1.5 in needle, anteromedial approach Medications: 5 mL lidocaine 1 % Consent was given by the patient.      Clinical Data: No additional findings.  ROS:  All other systems negative, except as noted in the HPI. Review of Systems  Constitutional: Negative.   Musculoskeletal:  Positive for arthralgias and myalgias.    Objective: Vital Signs: There were no vitals taken for this visit.  Specialty Comments:  No specialty comments available.  PMFS History: Patient Active Problem List   Diagnosis Date Noted   Prepatellar bursitis of right knee 07/21/2016   Menorrhagia 08/23/2012   Anemia 08/23/2012   Past Medical History:  Diagnosis Date   Anemia     No family history on file.  Past Surgical History:  Procedure Laterality Date   CESAREAN SECTION  1990   CHOLECYSTECTOMY  1985   DILITATION & CURRETTAGE/HYSTROSCOPY WITH HYDROTHERMAL ABLATION  08/23/2012   Procedure: DILATATION & CURETTAGE/HYSTEROSCOPY WITH HYDROTHERMAL ABLATION;  Surgeon: Dorien Chihuahua. Richardson Dopp, MD;  Location: [facility];  Service: Gynecology;  Laterality: N/A;   Social  History   Occupational History   Not on file  Tobacco Use   Smoking status: Never   Smokeless tobacco: Never  Substance and Sexual Activity   Alcohol use: Yes    Comment: social   Drug use: No   Sexual activity: Never

## 2023-08-11 ENCOUNTER — Ambulatory Visit (INDEPENDENT_AMBULATORY_CARE_PROVIDER_SITE_OTHER): Payer: 59 | Admitting: Family

## 2023-08-11 ENCOUNTER — Encounter: Payer: Self-pay | Admitting: Family

## 2023-08-11 DIAGNOSIS — M1711 Unilateral primary osteoarthritis, right knee: Secondary | ICD-10-CM

## 2023-08-11 MED ORDER — METHYLPREDNISOLONE ACETATE 40 MG/ML IJ SUSP
40.0000 mg | INTRAMUSCULAR | Status: AC | PRN
  Administered 2023-08-11: 40 mg via INTRA_ARTICULAR

## 2023-08-11 MED ORDER — LIDOCAINE HCL 1 % IJ SOLN
5.0000 mL | INTRAMUSCULAR | Status: AC | PRN
  Administered 2023-08-11: 5 mL

## 2023-08-11 NOTE — Progress Notes (Signed)
Office Visit Note   Patient: Mackenzie Cox           Date of Birth: 08-12-1961           MRN: [id number] Visit Date: 08/11/2023              Requested by: Soundra Pilon, FNP [phone] [place],  [place] [address] PCP: Soundra Pilon, FNP  Chief Complaint  Patient presents with   Right Knee - Follow-up      HPI: The patient is a 62 year old woman who presents for result return of her arthritic pain right knee.  She denies mechanical symptoms having increased pain with weightbearing as well as crepitation  Last injection was nearly a year ago and has provided her relief until recently  Assessment & Plan: Visit Diagnoses: No diagnosis found.  Plan: Depo-Medrol injection right knee.  Patient tolerated well.  Follow-Up Instructions: No follow-ups on file.   Right Knee Exam   Muscle Strength  The patient has normal right knee strength.  Tenderness  The patient is experiencing tenderness in the medial joint line.  Range of Motion  The patient has normal right knee ROM.  Tests  Varus: negative Valgus: negative  Other  Erythema: absent Swelling: mild      Patient is alert, oriented, no adenopathy, well-dressed, normal affect, normal respiratory effort.   Imaging: No results found. No images are attached to the encounter.  Labs: No results found for: "HGBA1C", "ESRSEDRATE", "CRP", "LABURIC", "REPTSTATUS", "GRAMSTAIN", "CULT", "LABORGA"   No results found for: "ALBUMIN", "PREALBUMIN", "CBC"  No results found for: "MG" No results found for: "VD25OH"  No results found for: "PREALBUMIN"    Latest Ref Rng & Units 08/23/2012   12:06 PM  CBC EXTENDED  WBC 4.0 - 10.5 K/uL 5.1   RBC 3.87 - 5.11 MIL/uL 4.20   Hemoglobin 12.0 - 15.0 g/dL 56.2   HCT 13.0 - 86.5 % 33.2   Platelets 150 - 400 K/uL 367      There is no height or weight on file to calculate BMI.  Orders:  No orders of the defined types were placed in this  encounter.  No orders of the defined types were placed in this encounter.    Procedures: Large Joint Inj: R knee on 08/11/2023 8:34 AM Indications: pain Details: 18 G 1.5 in needle, anteromedial approach Medications: 5 mL lidocaine 1 %; 40 mg methylPREDNISolone acetate 40 MG/ML Consent was given by the patient.      Clinical Data: No additional findings.  ROS:  All other systems negative, except as noted in the HPI. Review of Systems  Objective: Vital Signs: There were no vitals taken for this visit.  Specialty Comments:  No specialty comments available.  PMFS History: Patient Active Problem List   Diagnosis Date Noted   Prepatellar bursitis of right knee 07/21/2016   Menorrhagia 08/23/2012   Anemia 08/23/2012   Past Medical History:  Diagnosis Date   Anemia     No family history on file.  Past Surgical History:  Procedure Laterality Date   CESAREAN SECTION  1990   CHOLECYSTECTOMY  1985   DILITATION & CURRETTAGE/HYSTROSCOPY WITH HYDROTHERMAL ABLATION  08/23/2012   Procedure: DILATATION & CURETTAGE/HYSTEROSCOPY WITH HYDROTHERMAL ABLATION;  Surgeon: Dorien Chihuahua. Richardson Dopp, MD;  Location: [facility];  Service: Gynecology;  Laterality: N/A;   Social History   Occupational History   Not on file  Tobacco Use   Smoking status: Never  Smokeless tobacco: Never  Substance and Sexual Activity   Alcohol use: Yes    Comment: social   Drug use: No   Sexual activity: Never

## 2024-07-25 ENCOUNTER — Encounter: Payer: Self-pay | Admitting: Radiology
